# Patient Record
Sex: Male | Born: 1965 | Race: Black or African American | Hispanic: No | Smoking: Current every day smoker
Health system: Southern US, Community
[De-identification: ages and names within clinical notes are randomized; demographics above are authoritative.]

## PROBLEM LIST (undated history)

## (undated) DIAGNOSIS — N529 Male erectile dysfunction, unspecified: Secondary | ICD-10-CM

## (undated) DIAGNOSIS — N401 Enlarged prostate with lower urinary tract symptoms: Secondary | ICD-10-CM

## (undated) DIAGNOSIS — N138 Other obstructive and reflux uropathy: Secondary | ICD-10-CM

## (undated) DIAGNOSIS — F1910 Other psychoactive substance abuse, uncomplicated: Secondary | ICD-10-CM

## (undated) HISTORY — PX: BUNIONECTOMY: SHX129

## (undated) HISTORY — DX: Other psychoactive substance abuse, uncomplicated: F19.10

## (undated) HISTORY — PX: KNEE ARTHROSCOPY: SHX127

## (undated) HISTORY — DX: Other obstructive and reflux uropathy: N13.8

## (undated) HISTORY — DX: Male erectile dysfunction, unspecified: N52.9

## (undated) HISTORY — DX: Other obstructive and reflux uropathy: N40.1

---

## 2016-12-08 ENCOUNTER — Emergency Department
Admission: EM | Admit: 2016-12-08 | Discharge: 2016-12-08 | Disposition: A | Discharge status: Home / Self Care | Payer: Self-pay | Attending: Emergency Medicine | Admitting: Emergency Medicine

## 2016-12-08 ENCOUNTER — Emergency Department: Payer: Self-pay

## 2016-12-08 ENCOUNTER — Encounter: Payer: Self-pay | Admitting: Emergency Medicine

## 2016-12-08 DIAGNOSIS — X501XXA Overexertion from prolonged static or awkward postures, initial encounter: Secondary | ICD-10-CM | POA: Insufficient documentation

## 2016-12-08 DIAGNOSIS — Y9367 Activity, basketball: Secondary | ICD-10-CM | POA: Insufficient documentation

## 2016-12-08 DIAGNOSIS — S83411A Sprain of medial collateral ligament of right knee, initial encounter: Secondary | ICD-10-CM | POA: Insufficient documentation

## 2016-12-08 DIAGNOSIS — Y929 Unspecified place or not applicable: Secondary | ICD-10-CM | POA: Insufficient documentation

## 2016-12-08 DIAGNOSIS — Y999 Unspecified external cause status: Secondary | ICD-10-CM | POA: Insufficient documentation

## 2016-12-08 MED ORDER — MELOXICAM 15 MG PO TABS: 15.0000 mg | ORAL_TABLET | Freq: Every day | ORAL | 0 refills | Status: DC

## 2016-12-08 MED ORDER — TRAMADOL HCL 50 MG PO TABS: 50.0000 mg | ORAL_TABLET | Freq: Four times a day (QID) | ORAL | 0 refills | Status: DC | PRN

## 2016-12-08 NOTE — ED Triage Notes (Signed)
Pt states he was playing basketball last night when he went up for a jump states he felt his leg twist in the opposite direction.  Put has brace on right knee and using crutches.

## 2016-12-08 NOTE — ED Provider Notes (Signed)
[facility] Emergency Department Provider Note ____________________________________________  Time seen: Approximately 11:46 AM  I have reviewed the triage vital signs and the nursing notes.   HISTORY  Chief Complaint Leg Pain    HPI Philip Collins is a 51 y.o. male who presents to the emergency department for evaluation of right knee pain. While playing basketball last night, he went up for a jump and states that he felt his leg twisted in an awkward manner. Since that time it has remained painful. He has applied compression, elevated, iced it, and use crutches but pain does not seem to be improving. He states that he did take some ibuprofen which did not seem to help. He denies previous injury to the right knee.  History reviewed. No pertinent past medical history.  There are no active problems to display for this patient.   Past Surgical History:  Procedure Laterality Date  . KNEE ARTHROSCOPY      Prior to Admission medications   Medication Sig Start Date End Date Taking? Authorizing Provider  meloxicam (MOBIC) 15 MG tablet Take 1 tablet (15 mg total) by mouth daily. 12/08/16   Parthiv Mucci B, FNP  traMADol (ULTRAM) 50 MG tablet Take 1 tablet (50 mg total) by mouth every 6 (six) hours as needed. 12/08/16   Victorino Dike, FNP    Allergies Patient has no known allergies.  History reviewed. No pertinent family history.  Social History Social History  Substance Use Topics  . Smoking status: Never Smoker  . Smokeless tobacco: Never Used  . Alcohol use No    Review of Systems Constitutional: Well appearing Respiratory: Negative for shortness for Musculoskeletal: Positive for right knee pain. Skin: Negative for lesion or wound  Neurological: Negative for paresthesias or loss of sensation of the right lower extremity  ____________________________________________   PHYSICAL EXAM:  VITAL SIGNS: ED Triage Vitals [12/08/16 1052]  Enc Vitals  Group     BP (!) 148/101     Pulse Rate 76     Resp 18     Temp 97.9 F (36.6 C)     Temp Source Oral     SpO2 100 %     Weight 175 lb (79.4 kg)     Height 6' (1.829 m)     Head Circumference      Peak Flow      Pain Score      Pain Loc      Pain Edu?      Excl. in [place]?     Constitutional: Alert and oriented. Well appearing and in no acute distress. Eyes: Conjunctivae are clear without drainage  Head: Atraumatic Neck: Nexus criteria is negative. No stridor. Respiratory: Respirations are even and unlabored. Musculoskeletal: Right knee has limited range of motion secondary to pain. Patient is unable to fully extend the knee without increasing pain. Tenderness is directly over the medial aspect of the knee. Pain is worse with valgus stress. Neurologic: 2 point discrimination intact  Skin: Warm and dry without lesion or wound.  Psychiatric: Normal behavior. Viral affect.  ____________________________________________   LABS (all labs ordered are listed, but only abnormal results are displayed)  Labs Reviewed - No data to display ____________________________________________  RADIOLOGY  Right knee film negative for acute bony abnormality per radiology. ____________________________________________   PROCEDURES  Procedure(s) performed: None  ____________________________________________   INITIAL IMPRESSION / ASSESSMENT AND PLAN / ED COURSE  Philip Collins is a 51 y.o. male who presents to the emergency department  for evaluation of right knee pain. Symptoms and exam consistent with medial collateral strain of the right knee. Patient is to follow-up with the orthopedist and continue to use his knee brace and crutches. He was encouraged to return to the emergency department for any symptom changes or worsens if he is unable schedule an appointment.   Pertinent labs & imaging results that were available during my care of the patient were reviewed by me and considered in my  medical decision making (see chart for details).  _________________________________________   FINAL CLINICAL IMPRESSION(S) / ED DIAGNOSES  Final diagnoses:  Sprain of medial collateral ligament of right knee, initial encounter    Discharge Medication List as of 12/08/2016 11:56 AM    START taking these medications   Details  meloxicam (MOBIC) 15 MG tablet Take 1 tablet (15 mg total) by mouth daily., Starting Sun 12/08/2016, Print    traMADol (ULTRAM) 50 MG tablet Take 1 tablet (50 mg total) by mouth every 6 (six) hours as needed., Starting Sun 12/08/2016, Print        If controlled substance prescribed during this visit, 12 month history viewed on the [place] prior to issuing an initial prescription for Schedule II or III opiod.    Victorino Dike, FNP 12/12/16 2039    Harvest Dark, MD 12/14/16 [phone]

## 2017-12-10 ENCOUNTER — Emergency Department
Admission: EM | Admit: 2017-12-10 | Discharge: 2017-12-10 | Disposition: A | Discharge status: Home / Self Care | Payer: Self-pay | Attending: Student in an Organized Health Care Education/Training Program | Admitting: Student in an Organized Health Care Education/Training Program

## 2017-12-10 ENCOUNTER — Encounter: Payer: Self-pay | Admitting: Emergency Medicine

## 2017-12-10 ENCOUNTER — Other Ambulatory Visit: Payer: Self-pay

## 2017-12-10 DIAGNOSIS — F17210 Nicotine dependence, cigarettes, uncomplicated: Secondary | ICD-10-CM | POA: Insufficient documentation

## 2017-12-10 DIAGNOSIS — J069 Acute upper respiratory infection, unspecified: Secondary | ICD-10-CM | POA: Insufficient documentation

## 2017-12-10 DIAGNOSIS — B9789 Other viral agents as the cause of diseases classified elsewhere: Secondary | ICD-10-CM | POA: Insufficient documentation

## 2017-12-10 MED ORDER — PSEUDOEPH-BROMPHEN-DM 30-2-10 MG/5ML PO SYRP: 5.0000 mL | ORAL_SOLUTION | Freq: Four times a day (QID) | ORAL | 0 refills | Status: DC | PRN

## 2017-12-10 NOTE — ED Notes (Signed)
D/C form printed, signed by patient and sent to Medical Records.

## 2017-12-10 NOTE — ED Provider Notes (Addendum)
[facility] Emergency Department Provider Note  ____________________________________________   First MD Initiated Contact with Patient 12/10/17 1323     (approximate)  I have reviewed the triage vital signs and the nursing notes.   HISTORY  Chief Complaint Cough   HPI Philip Collins is a 52 y.o. male is here complaining of yellow productive cough for the last 8 days.  Patient states he is tried some over-the-counter medication such as Alka-Seltzer and Mucinex without any relief.  He states that initially he was sneezing a great deal.  He denies any fever or chills.  He denies any throat pain.  Patient is a longtime smoker smoking 1/2 pack cigarettes on the average per day.   History reviewed. No pertinent past medical history.  There are no active problems to display for this patient.   Past Surgical History:  Procedure Laterality Date  . BUNIONECTOMY    . KNEE ARTHROSCOPY      Prior to Admission medications   Medication Sig Start Date End Date Taking? Authorizing Provider  brompheniramine-pseudoephedrine-DM 30-2-10 MG/5ML syrup Take 5 mLs by mouth 4 (four) times daily as needed. 12/10/17   Johnn Hai, PA-C    Allergies Patient has no known allergies.  No family history on file.  Social History Social History   Tobacco Use  . Smoking status: Never Smoker  . Smokeless tobacco: Never Used  Substance Use Topics  . Alcohol use: No  . Drug use: Not on file    Review of Systems Constitutional: No fever/chills ENT: No sore throat.  Negative for ear pain. Cardiovascular: Denies chest pain. Respiratory: Denies shortness of breath.  Positive for productive cough Gastrointestinal: No abdominal pain.  No nausea, no vomiting.   Musculoskeletal: Negative for muscle aches. Skin: Negative for rash. Neurological: Negative for headaches, focal weakness or numbness. ____________________________________________   PHYSICAL EXAM:  VITAL  SIGNS: ED Triage Vitals  Enc Vitals Group     BP      Pulse      Resp      Temp      Temp src      SpO2      Weight      Height      Head Circumference      Peak Flow      Pain Score      Pain Loc      Pain Edu?      Excl. in [place]?    Constitutional: Alert and oriented. Well appearing and in no acute distress. Eyes: Conjunctivae are normal.  Head: Atraumatic. Nose: Minimal congestion/rhinnorhea.  TMs are dull bilaterally. Mouth/Throat: Mucous membranes are moist.  Oropharynx non-erythematous.  Mild posterior drainage noted.  Uvula is midline. Neck: No stridor.   Hematological/Lymphatic/Immunilogical: No cervical lymphadenopathy. Cardiovascular: Normal rate, regular rhythm. Grossly normal heart sounds.  Good peripheral circulation. Respiratory: Normal respiratory effort.  No retractions. Lungs CTAB. Musculoskeletal: Moves upper and lower extremities without any difficulty and normal gait was noted. Neurologic:  Normal speech and language. No gross focal neurologic deficits are appreciated.  Skin:  Skin is warm, dry and intact. No rash noted. Psychiatric: Mood and affect are normal. Speech and behavior are normal.  ____________________________________________   LABS (all labs ordered are listed, but only abnormal results are displayed)  Labs Reviewed - No data to display  PROCEDURES  Procedure(s) performed: None  Procedures  Critical Care performed: No  ____________________________________________   INITIAL IMPRESSION / ASSESSMENT AND PLAN / ED COURSE  As part of my medical decision making, I reviewed the following data within the electronic MEDICAL RECORD NUMBER Notes from prior ED visits and [place] Controlled Substance Database  Patient was started on Bromfed-DM for active cough for 8 days.  Patient is encouraged to discontinue smoking.  [facility] any [facility] problems.  ____________________________________________   FINAL CLINICAL  IMPRESSION(S) / ED DIAGNOSES  Final diagnoses:  Viral URI with cough  Cigarette smoker     ED Discharge Orders        Ordered    brompheniramine-pseudoephedrine-DM 30-2-10 MG/5ML syrup  4 times daily PRN     12/10/17 1346       Note:  This document was prepared using Dragon voice recognition software and may include unintentional dictation errors.    Johnn Hai, PA-C 12/10/17 1348    Johnn Hai, PA-C 12/10/17 1356    Merlyn Lot, MD 12/10/17 [phone]

## 2017-12-10 NOTE — Discharge Instructions (Addendum)
[facility] if any continued problems.  Begin taking Bromfed-DM as needed for cough and congestion every 6 hours.  Increase fluids.  Decrease smoking.

## 2017-12-10 NOTE — ED Triage Notes (Signed)
Patient says he has had a cough-productive yellow/brown and red streaks now--for 8 days.  He has tried multitple otc medications without relief.  No fever.  Says hit hurts his neck and chest when he coughs.  When not coughing his neck still hurts.  Says decreased appetitie as well.

## 2017-12-10 NOTE — ED Notes (Signed)
Pt verbalizes understanding of d/c instructions, medications and follow up 

## 2018-11-27 IMAGING — CR DG KNEE COMPLETE 4+V*R*
1 series · 5 of 5 positions shown · non-contrast
Comparison: None.

CLINICAL DATA: Basketball injury last night while jumping. Unable
to walk or bear weight.

EXAM:
RIGHT KNEE - COMPLETE 4+ VIEW

[Series 1: t knee ap right · 0.14mm/px · 5 of 5 slices shown]
[im 1/5]
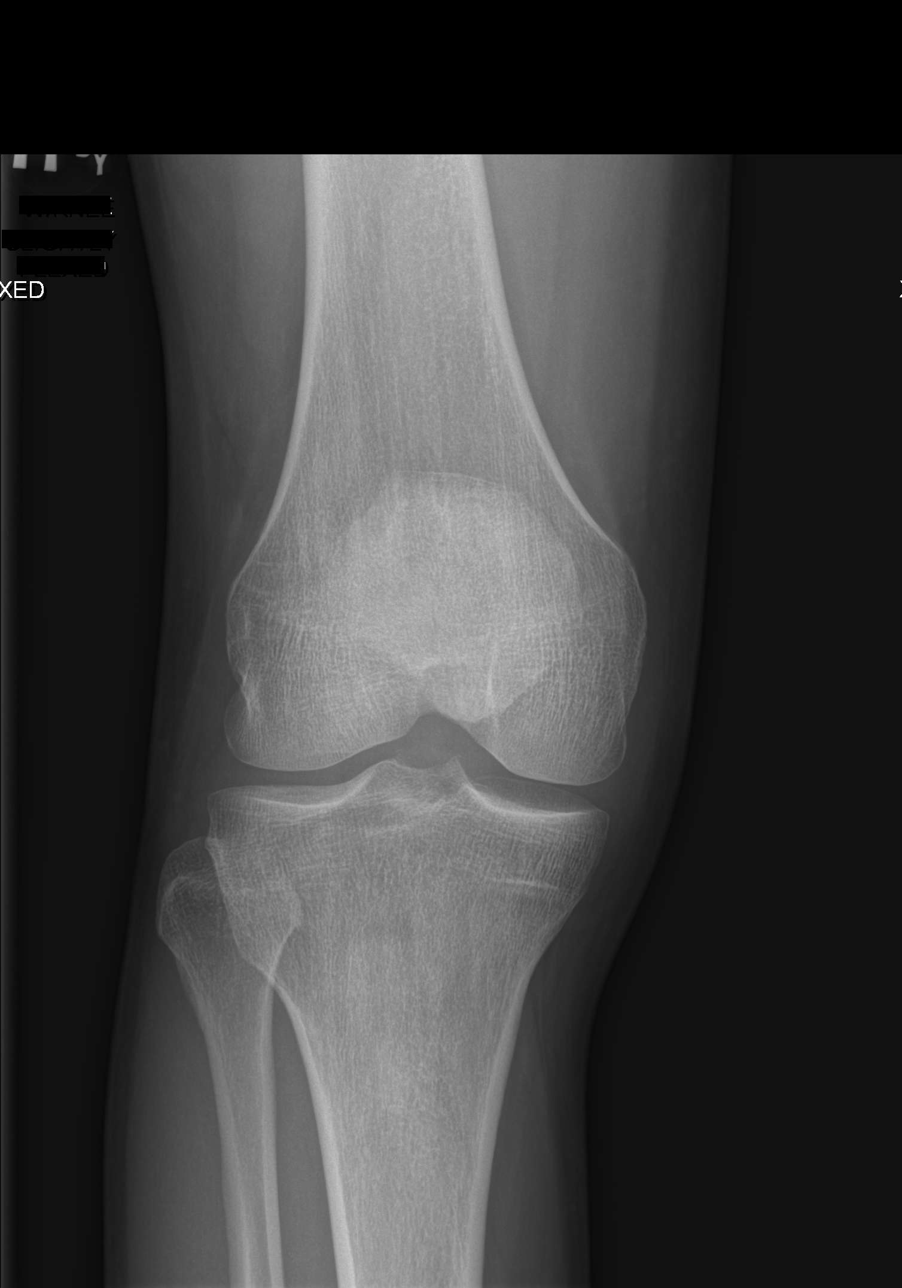
[im 2/5]
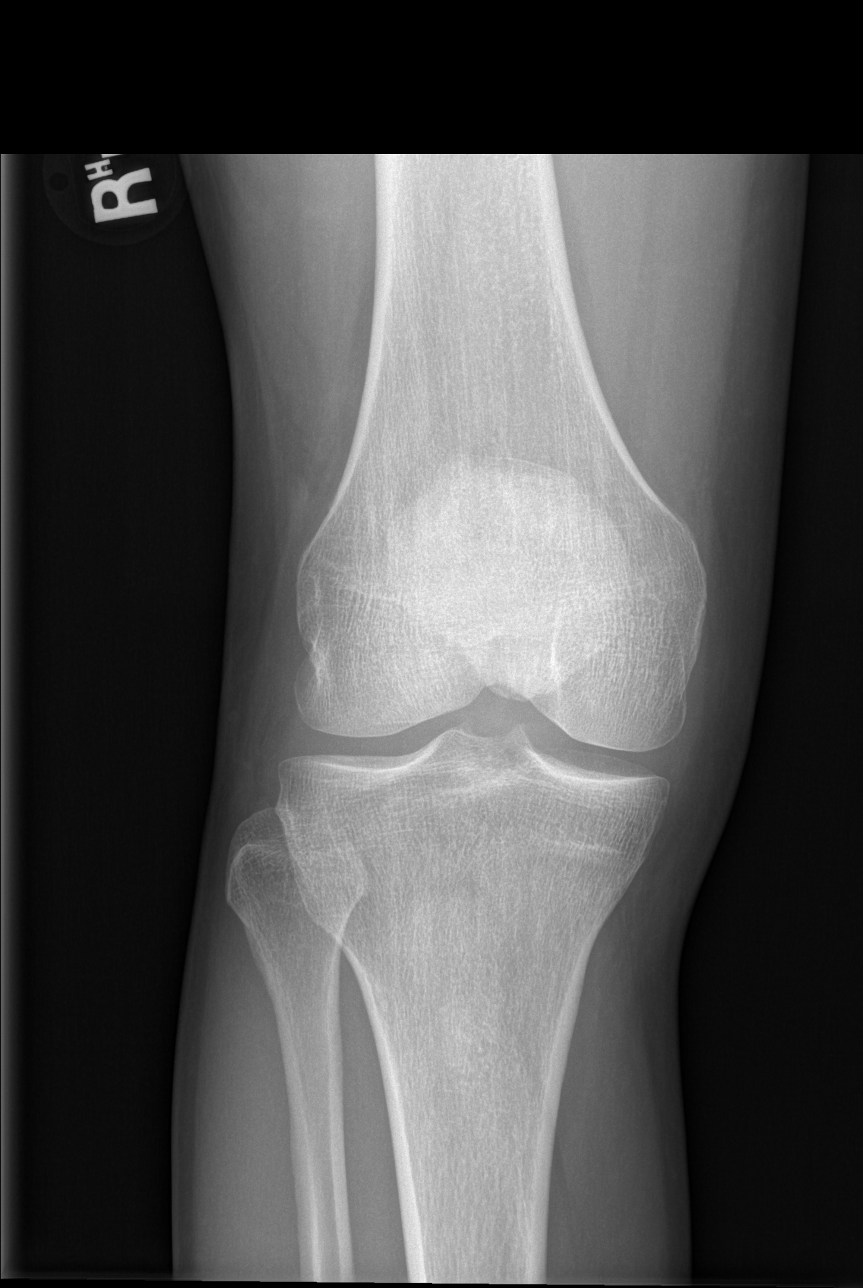
[im 3/5]
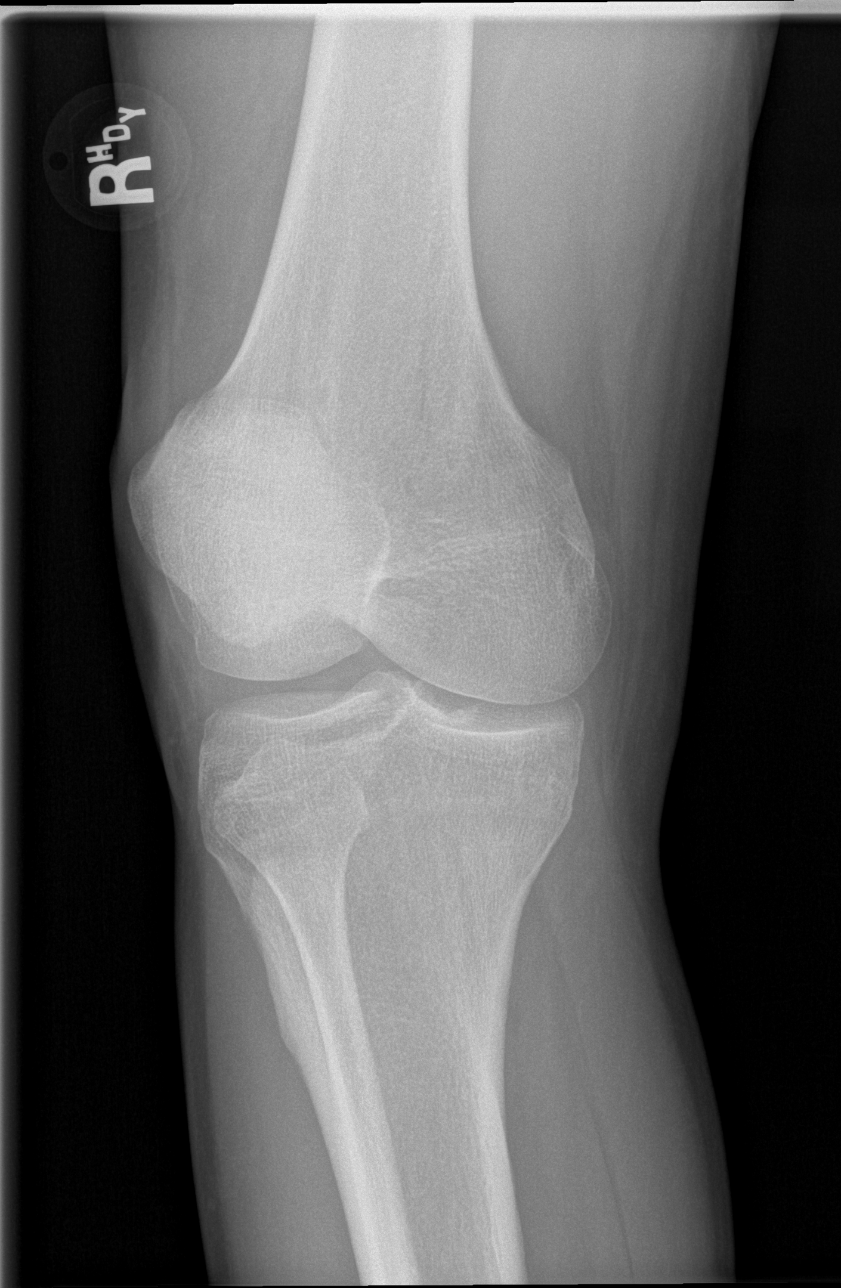
[im 4/5]
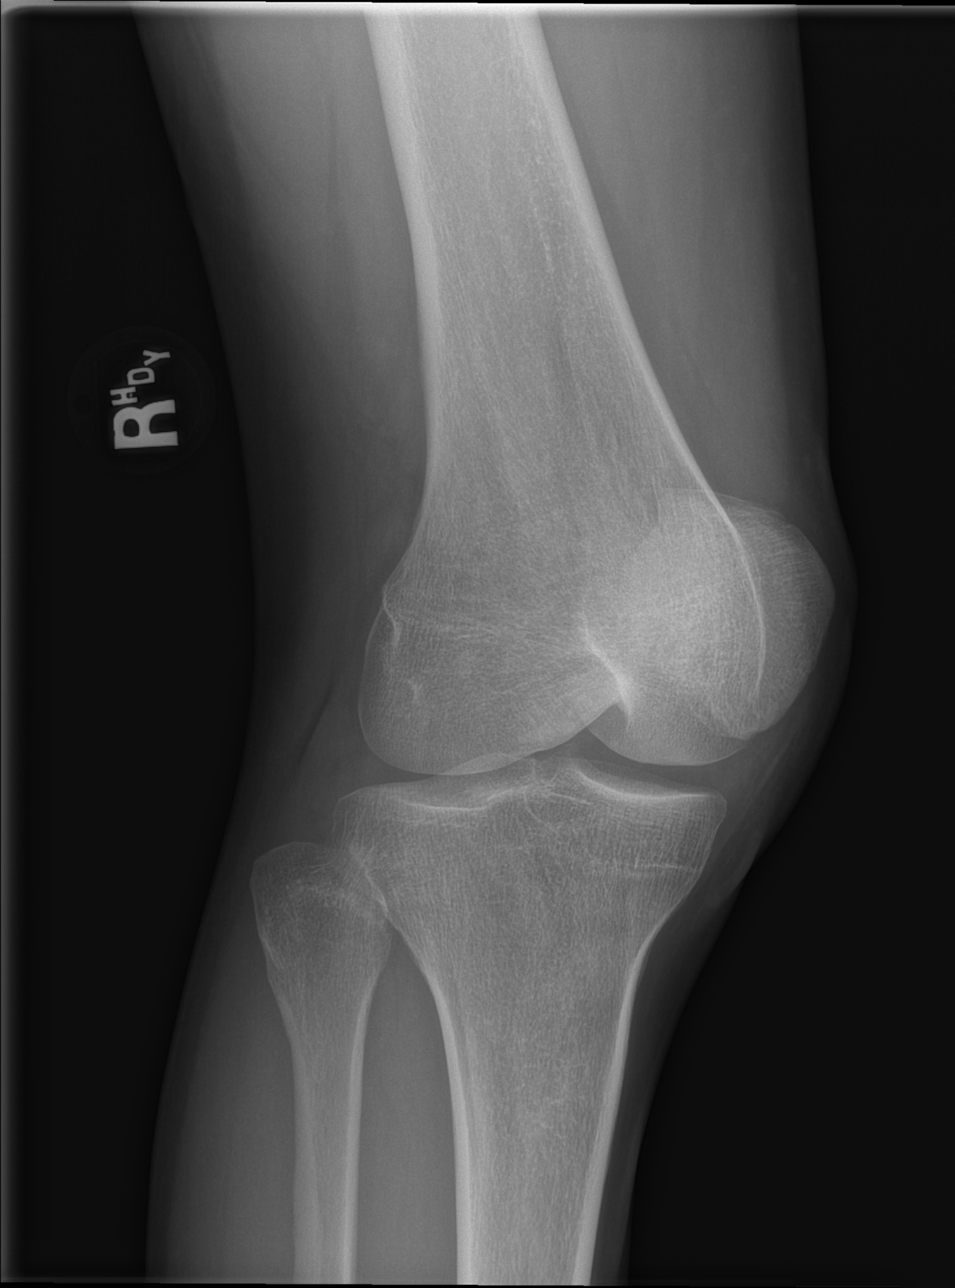
[im 5/5]
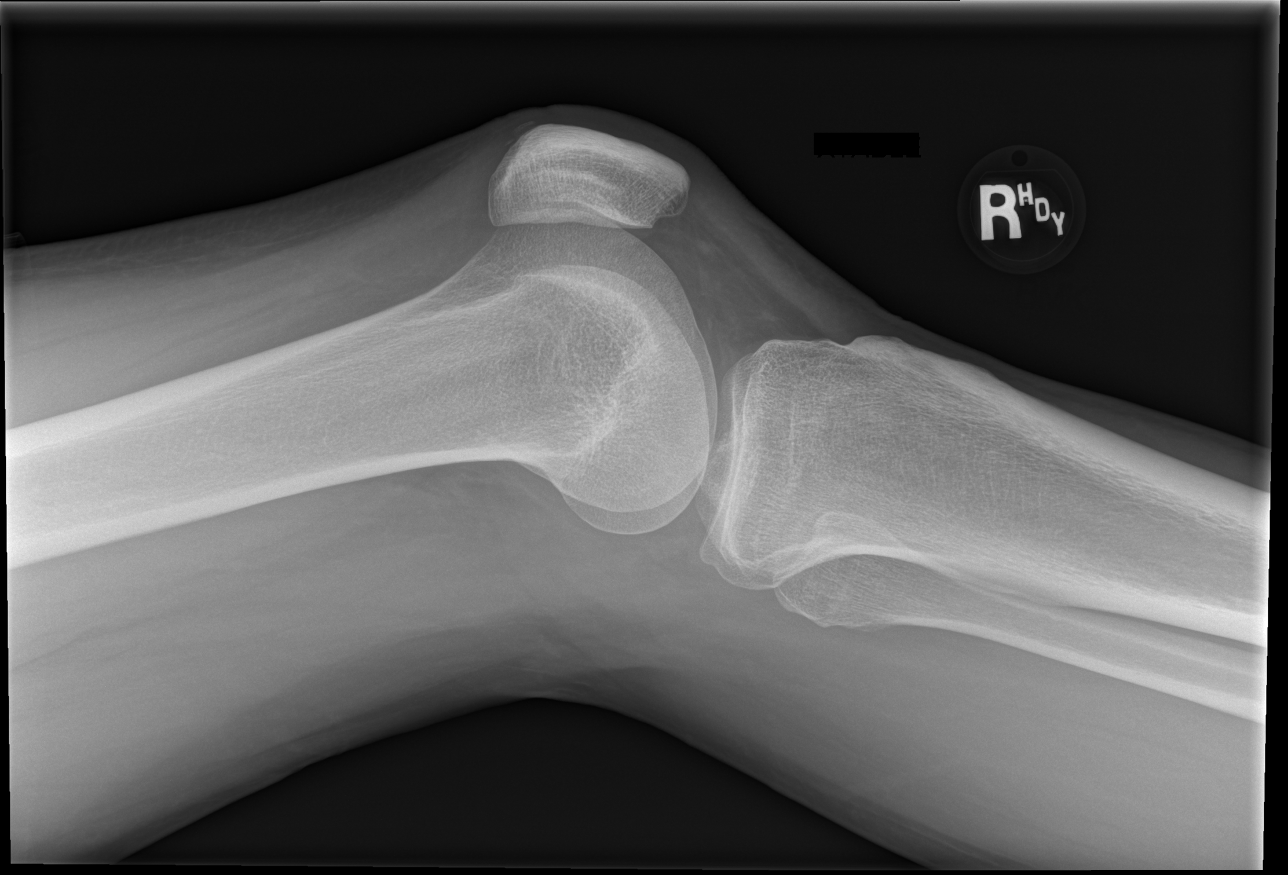

[5 of 5 positions shown; findings below may reference images not displayed]

FINDINGS: No fracture. No subluxation or dislocation. No worrisome lytic or
sclerotic osseous abnormality. No evidence for joint effusion in the
suprapatellar bursa.
IMPRESSION: Negative.

## 2021-10-04 ENCOUNTER — Emergency Department
Admission: EM | Admit: 2021-10-04 | Discharge: 2021-10-04 | Disposition: A | Discharge status: Home / Self Care | Payer: Self-pay | Attending: Emergency Medicine | Admitting: Emergency Medicine

## 2021-10-04 ENCOUNTER — Other Ambulatory Visit: Payer: Self-pay

## 2021-10-04 ENCOUNTER — Encounter: Payer: Self-pay | Admitting: Emergency Medicine

## 2021-10-04 ENCOUNTER — Emergency Department: Payer: Self-pay

## 2021-10-04 DIAGNOSIS — R634 Abnormal weight loss: Secondary | ICD-10-CM | POA: Insufficient documentation

## 2021-10-04 DIAGNOSIS — J069 Acute upper respiratory infection, unspecified: Secondary | ICD-10-CM | POA: Insufficient documentation

## 2021-10-04 DIAGNOSIS — R5383 Other fatigue: Secondary | ICD-10-CM

## 2021-10-04 DIAGNOSIS — Z20822 Contact with and (suspected) exposure to covid-19: Secondary | ICD-10-CM | POA: Insufficient documentation

## 2021-10-04 LAB — COMPREHENSIVE METABOLIC PANEL
ALT: 18 U/L (ref 0–44)
AST: 21 U/L (ref 15–41)
Albumin: 3.3 g/dL — ABNORMAL LOW (ref 3.5–5.0)
Alkaline Phosphatase: 63 U/L (ref 38–126)
Anion gap: 7 (ref 5–15)
BUN: 14 mg/dL (ref 6–20)
CO2: 29 mmol/L (ref 22–32)
Calcium: 8.8 mg/dL — ABNORMAL LOW (ref 8.9–10.3)
Chloride: 105 mmol/L (ref 98–111)
Creatinine, Ser: 0.96 mg/dL (ref 0.61–1.24)
GFR, Estimated: >60 mL/min (ref >60)
Glucose, Bld: 98 mg/dL (ref 70–99)
Potassium: 4.4 mmol/L (ref 3.5–5.1)
Sodium: 141 mmol/L (ref 135–145)
Total Bilirubin: 0.6 mg/dL (ref 0.3–1.2)
Total Protein: 6.8 g/dL (ref 6.5–8.1)

## 2021-10-04 LAB — CBC WITH DIFFERENTIAL/PLATELET
Abs Immature Granulocytes: 0.01 10*3/uL (ref 0.00–0.07)
Basophils Absolute: 0.1 10*3/uL (ref 0.0–0.1)
Basophils Relative: 1 %
Eosinophils Absolute: 0.2 10*3/uL (ref 0.0–0.5)
Eosinophils Relative: 4 %
HCT: 43.2 % (ref 39.0–52.0)
Hemoglobin: 14.1 g/dL (ref 13.0–17.0)
Immature Granulocytes: 0 %
Lymphocytes Relative: 44 %
Lymphs Abs: 2.3 10*3/uL (ref 0.7–4.0)
MCH: 29.6 pg (ref 26.0–34.0)
MCHC: 32.6 g/dL (ref 30.0–36.0)
MCV: 90.6 fL (ref 80.0–100.0)
Monocytes Absolute: 0.4 10*3/uL (ref 0.1–1.0)
Monocytes Relative: 8 %
Neutro Abs: 2.3 10*3/uL (ref 1.7–7.7)
Neutrophils Relative %: 43 %
Platelets: 235 10*3/uL (ref 150–400)
RBC: 4.77 MIL/uL (ref 4.22–5.81)
RDW: 12.8 % (ref 11.5–15.5)
WBC: 5.3 10*3/uL (ref 4.0–10.5)
nRBC: 0 % (ref 0.0–0.2)

## 2021-10-04 LAB — TROPONIN I (HIGH SENSITIVITY): Troponin I (High Sensitivity): 5 ng/L (ref <18)

## 2021-10-04 LAB — TSH: TSH: 0.775 u[IU]/mL (ref 0.350–4.500)

## 2021-10-04 LAB — RESP PANEL BY RT-PCR (FLU A&B, COVID) ARPGX2
Influenza A by PCR: NEGATIVE
Influenza B by PCR: NEGATIVE
SARS Coronavirus 2 by RT PCR: NEGATIVE

## 2021-10-04 MED ORDER — BENZONATATE 100 MG PO CAPS: 100.0000 mg | ORAL_CAPSULE | Freq: Three times a day (TID) | ORAL | 0 refills | Status: DC | PRN

## 2021-10-04 MED ORDER — AZITHROMYCIN 250 MG PO TABS: ORAL_TABLET | ORAL | 0 refills | Status: DC

## 2021-10-04 MED ORDER — SODIUM CHLORIDE 0.9 % IV BOLUS
1000.0000 mL | Freq: Once | INTRAVENOUS | Status: AC
  Administered 2021-10-04: 1000 mL via INTRAVENOUS

## 2021-10-04 NOTE — ED Triage Notes (Signed)
Pt to ED via POV c/o cough and runny nose x 1 week. Pt states that he is also having some weakness. Pt reports that his cough is productive. Pt states he is getting up dark brown phlegm. Pt is able to speak in complete sentences and is in NAD.  ?

## 2021-10-04 NOTE — Discharge Instructions (Addendum)
Follow-up with one of the clinics listed above.  He will need to establish a regular doctor if he continued to have weight loss.  Your labs are normal.  Your thyroid is normal.  Your chest x-ray was normal.  COVID and flu test are negative.  In the enzyme for your heart muscle is normal. ?Take the antibiotic as prescribed.  Take the cough medicine as needed.  Drink plenty of fluids.  Take emergen-C vitamins.  This may help boost your immune system.  If you continue to worsen please return the emergency department. ?

## 2021-10-04 NOTE — ED Provider Notes (Addendum)
? ?[facility] ?Provider Note ? ? ? Event Date/Time  ? First MD Initiated Contact with Patient 10/04/21 1610   ?  (approximate) ? ? ?History  ? ?Cough ? ? ?HPI ? ?Philip Collins is a 56 y.o. male complains of weakness, productive cough, 8 pound weight loss this week, fatigue, denies fever or chills.  States his throat is a little irritated but not bad.  No vomiting or diarrhea.  States he has been eating and is still losing weight.  States he is short of breath.  Denies chest pain ? ?  ? ? ?Physical Exam  ? ?Triage Vital Signs: ?ED Triage Vitals  ?Enc Vitals Group  ?   BP 10/04/21 1610 110/81  ?   Pulse Rate 10/04/21 1610 64  ?   Resp 10/04/21 1610 18  ?   Temp 10/04/21 1610 97.9 ?F (36.6 ?C)  ?   Temp Source 10/04/21 1610 Oral  ?   SpO2 10/04/21 1610 100 %  ?   Weight 10/04/21 1611 164 lb 14.5 oz (74.8 kg)  ?   Height 10/04/21 1611 '5\' 11"'$  (1.803 m)  ?   Head Circumference --   ?   Peak Flow --   ?   Pain Score 10/04/21 1603 4  ?   Pain Loc --   ?   Pain Edu? --   ?   Excl. in [place]? --   ? ? ?Most recent vital signs: ?Vitals:  ? 10/04/21 1610  ?BP: 110/81  ?Pulse: 64  ?Resp: 18  ?Temp: 97.9 ?F (36.6 ?C)  ?SpO2: 100%  ? ? ? ?General: Awake, no distress.   ?CV:  Good peripheral perfusion. regular rate and  rhythm ?Resp:  Normal effort. Lungs CTA ?Abd:  No distention.   ?Other:  Patient is very thin. ? ? ?ED Results / Procedures / Treatments  ? ?Labs ?(all labs ordered are listed, but only abnormal results are displayed) ?Labs Reviewed  ?COMPREHENSIVE METABOLIC PANEL - Abnormal; Notable for the following components:  ?    Result Value  ? Calcium 8.8 (*)   ? Albumin 3.3 (*)   ? All other components within normal limits  ?RESP PANEL BY RT-PCR (FLU A&B, COVID) ARPGX2  ?CBC WITH DIFFERENTIAL/PLATELET  ?TSH  ?TROPONIN I (HIGH SENSITIVITY)  ?TROPONIN I (HIGH SENSITIVITY)  ? ? ? ?EKG ? ?EKG ? ? ?RADIOLOGY ?Chest x-ray ? ? ? ?PROCEDURES: ? ? ?Procedures ? ? ?MEDICATIONS ORDERED IN ED: ?Medications  ?sodium  chloride 0.9 % bolus 1,000 mL (1,000 mLs Intravenous New Bag/Given 10/04/21 1708)  ? ? ? ?IMPRESSION / MDM / ASSESSMENT AND PLAN / ED COURSE  ?I reviewed the triage vital signs and the nursing notes. ?             ?               ? ?Differential diagnosis includes, but is not limited to, CAP, influenza, COVID, MI, cancer, thyroid disease, ? ?Patient will be given IV with normal saline in hopes that the fatigue is coming from dehydration.  Labs are ordered imaging as ordered. ? ?Chest x-ray was independently reviewed by me, no infection is noted, confirmed by radiology. ? ?Labs are reassuring, CBC, metabolic panel, troponin are all normal.  Respiratory panel is negative for influenza or COVID.  And his TSH level is normal.  Cannot find a reason for his weight loss and fatigue. ? ?However when discussing the results with the patient.  He  did tell me that he donated plasma 2 weeks ago.  Explained to him that he should refrain from giving plasma if it is causing this much fatigue.  If he continues to have weight loss he needs to see a regular doctor.  He was given the addresses and phone number of 3 different clinics that help with discounted care.  He was given a work note.  Patient is in agreement treatment plan.  He was discharged stable condition. ? ?EKG showed bradycardia with 59 bpm, PR intervals 116 MS, QRS 76 MS, see physician read. ? ? ?  ? ? ?FINAL CLINICAL IMPRESSION(S) / ED DIAGNOSES  ? ?Final diagnoses:  ?Acute URI  ?Other fatigue  ?Weight loss, non-intentional  ? ? ? ?Rx / DC Orders  ? ?ED Discharge Orders   ? ?      Ordered  ?  azithromycin (ZITHROMAX Z-PAK) 250 MG tablet       ? 10/04/21 1822  ?  benzonatate (TESSALON PERLES) 100 MG capsule  3 times daily PRN       ? 10/04/21 1822  ? ?  ?  ? ?  ? ? ? ?Note:  This document was prepared using Dragon voice recognition software and may include unintentional dictation errors. ? ?  ?Versie Starks, PA-C ?10/04/21 1826 ? ?  ?Versie Starks, PA-C ?10/04/21  1829 ? ?  ?Blake Divine, MD ?10/04/21 1933 ? ?

## 2022-01-04 ENCOUNTER — Other Ambulatory Visit: Payer: Self-pay

## 2022-01-04 ENCOUNTER — Other Ambulatory Visit: Payer: Self-pay | Admitting: Pharmacy Technician

## 2022-01-04 NOTE — Progress Notes (Signed)
Patient currently uninsured.  Received 2023 poi.  Eligible for medication assistance at [facility] as long as eligibility continues to be met.  Philip Collins J. Zyann Mabry Patient Advocate Specialist [facility]  

## 2022-01-15 ENCOUNTER — Ambulatory Visit: Payer: Medicaid Other | Admitting: Gerontology

## 2022-01-15 ENCOUNTER — Other Ambulatory Visit: Payer: Self-pay

## 2022-01-15 ENCOUNTER — Encounter: Payer: Self-pay | Admitting: Gerontology

## 2022-01-15 VITALS — BP 106/70 | HR 54 | Temp 97.7°F | Resp 16 | Ht 71.0 in | Wt 167.6 lb

## 2022-01-15 DIAGNOSIS — R001 Bradycardia, unspecified: Secondary | ICD-10-CM | POA: Insufficient documentation

## 2022-01-15 DIAGNOSIS — Z7689 Persons encountering health services in other specified circumstances: Secondary | ICD-10-CM | POA: Insufficient documentation

## 2022-01-15 DIAGNOSIS — M79651 Pain in right thigh: Secondary | ICD-10-CM

## 2022-01-15 DIAGNOSIS — IMO0001 Reserved for inherently not codable concepts without codable children: Secondary | ICD-10-CM

## 2022-01-15 DIAGNOSIS — S025XXA Fracture of tooth (traumatic), initial encounter for closed fracture: Secondary | ICD-10-CM

## 2022-01-15 DIAGNOSIS — R339 Retention of urine, unspecified: Secondary | ICD-10-CM

## 2022-01-15 DIAGNOSIS — F17200 Nicotine dependence, unspecified, uncomplicated: Secondary | ICD-10-CM | POA: Insufficient documentation

## 2022-01-15 HISTORY — DX: Retention of urine, unspecified: R33.9

## 2022-01-15 HISTORY — DX: Bradycardia, unspecified: R00.1

## 2022-01-15 HISTORY — DX: Fracture of tooth (traumatic), initial encounter for closed fracture: S02.5XXA

## 2022-01-15 NOTE — Progress Notes (Signed)
New Patient Office Visit  Subjective    Patient ID: Philip Collins, male    DOB: 31-Jul-1965  Age: 56 y.o. MRN: [id number]  CC:  Chief Complaint  Patient presents with   Establish Care   Leg Pain    Patient c/o right upper thigh pain off & on x 3 weeks. No injury noted. Patient states pain is worse with ambulation.    HPI Philip Collins  is a 56 y/o male with no significant past medical history presents to establish care. He resides at [facility] for rehabilitation. He smokes 1/2 pack of cigarette daily and admits the desire to quit. He reports experiencing intermittent, nontraumatic, nonradiating sharp pain to right inner thigh that started 3 weeks ago. He has not taking any medication for the pain, states that pain affects his walking and has noticed the pain all caught 7 times in the past 3 weeks. He states that pain might last for several hours before it resolves.  He denies any aggravating or relieving factors.  He denies any swelling, erythema to perianal area and penile discharge. He also reports straining with urination, urgency and decrease urine flow.He reports having a broken tooth and requests dental referral.  His heart rate was 54 bpm, he denies chest pain, fatigue, dizziness and shortness of breath.  Overall, he states that he is doing well and offers no further complaint.   Outpatient Encounter Medications as of 01/15/2022  Medication Sig   [DISCONTINUED] azithromycin (ZITHROMAX Z-PAK) 250 MG tablet 2 pills today then 1 pill a day for 4 days   [DISCONTINUED] benzonatate (TESSALON PERLES) 100 MG capsule Take 1 capsule (100 mg total) by mouth 3 (three) times daily as needed for cough.   [DISCONTINUED] brompheniramine-pseudoephedrine-DM 30-2-10 MG/5ML syrup Take 5 mLs by mouth 4 (four) times daily as needed.   No facility-administered encounter medications on file as of 01/15/2022.    Past Medical History:  Diagnosis Date   Substance abuse ([place])    drug    Past Surgical History:   Procedure Laterality Date   BUNIONECTOMY Bilateral    ~30 years ago   KNEE ARTHROSCOPY Left    ~when patient was [age]    Family History  Problem Relation Age of Onset   Hypertension Mother    Diabetes Mother    Kidney failure Mother    Other Father        "gum disease"   Hypertension Sister    Hypertension Sister    Other Maternal Grandmother        unknown medical history   Dementia Maternal Grandfather    Other Paternal Grandmother        unknown medical history   Other Paternal Grandfather        unknown medical history    Social History   Socioeconomic History   Marital status: Divorced    Spouse name: Not on file   Number of children: Not on file   Years of education: Not on file   Highest education level: Not on file  Occupational History   Not on file  Tobacco Use   Smoking status: Every Day    Packs/day: 0.50    Years: 37.00    Total pack years: 18.50    Types: Cigarettes   Smokeless tobacco: Never   Tobacco comments:    Patient is at [facility] since 10/10/21, previous to that he was homeless  Vaping Use   Vaping Use: Never used  Substance and Sexual Activity  Alcohol use: Not Currently    Comment: occassional, last use 10/10/21   Drug use: Not Currently    Types: Marijuana, Cocaine, "Crack" cocaine, Methamphetamines    Comment: last use 10/10/21   Sexual activity: Not on file  Other Topics Concern   Not on file  Social History Narrative   Not on file   Social Determinants of Health   Financial Resource Strain: Not on file  Food Insecurity: Food Insecurity Present (01/15/2022)   Hunger Vital Sign    Worried About Running Out of Food in the Last Year: Often true    Ran Out of Food in the Last Year: Often true  Transportation Needs: No Transportation Needs (01/15/2022)   PRAPARE - Hydrologist (Medical): No    Lack of Transportation (Non-Medical): No  Physical Activity: Not on file  Stress: Not on file  Social  Connections: Not on file  Intimate Partner Violence: Not on file    Review of Systems  Constitutional: Negative.   HENT: Negative.    Eyes: Negative.   Respiratory: Negative.    Cardiovascular: Negative.   Gastrointestinal: Negative.   Genitourinary: Negative.   Musculoskeletal: Negative.   Skin: Negative.   Neurological: Negative.   Endo/Heme/Allergies: Negative.   Psychiatric/Behavioral: Negative.          Objective    BP 106/70 (BP Location: Left Arm, Patient Position: Sitting, Cuff Size: Large)   Pulse (!) 54   Temp 97.7 F (36.5 C) (Oral)   Resp 16   Ht '5\' 11"'$  (1.803 m)   Wt 167 lb 9.6 oz (76 kg)   SpO2 98%   BMI 23.38 kg/m   Physical Exam HENT:     Head: Normocephalic.     Nose: Nose normal.     Mouth/Throat:     Mouth: Mucous membranes are moist.  Eyes:     Extraocular Movements: Extraocular movements intact.     Conjunctiva/sclera: Conjunctivae normal.     Pupils: Pupils are equal, round, and reactive to light.  Cardiovascular:     Rate and Rhythm: Bradycardia present.  Pulmonary:     Effort: Pulmonary effort is normal.     Breath sounds: Normal breath sounds.  Abdominal:     General: Abdomen is flat. Bowel sounds are normal.     Palpations: Abdomen is soft.  Genitourinary:    Comments: Deferred per patient Musculoskeletal:        General: Normal range of motion.     Cervical back: Normal range of motion.  Skin:    General: Skin is warm.  Neurological:     General: No focal deficit present.     Mental Status: He is alert and oriented to person, place, and time. Mental status is at baseline.  Psychiatric:        Mood and Affect: Mood normal.        Behavior: Behavior normal.        Thought Content: Thought content normal.        Judgment: Judgment normal.         Assessment & Plan:   1. Encounter to establish care -Routine labs will be checked. - HgB A1c; Future - PSA; Future - Lipid panel; Future - Lipid panel - PSA - HgB  A1c  2. Bradycardia -His heart rate was 54 bpm, he was advised to increase water intake and go to the emergency room for worsening symptoms.  3. Smoking -He was encouraged on smoking cessation  and will follow-up with  - CT CHEST LUNG CA SCREEN LOW DOSE W/O CM; Future  4. Incomplete bladder emptying -Urinalysis will be done and he was advised to complete [employer] application for - Ambulatory referral to Urology - Urinalysis; Future - Urinalysis  5. Closed fracture of tooth, initial encounter -He was provided with application for dental referral.   6. Right thigh pain -He was advised to take OTC  500 mg Tylenol for pain every 12 hours, and to go to the emergency room for worsening symptoms.  Return in about 4 weeks (around 02/12/2022), or if symptoms worsen or fail to improve.   Akeria Hedstrom Jerold Coombe, NP

## 2022-01-15 NOTE — Patient Instructions (Signed)
Smoking Tobacco Information, Adult Smoking tobacco can be harmful to your health. Tobacco contains a toxic colorless chemical called nicotine. Nicotine causes changes in your brain that make you want more and more. This is called addiction. This can make it hard to stop smoking once you start. Tobacco also has other toxic chemicals that can hurt your body and raise your risk of many cancers. Menthol or "lite" tobacco or cigarette brands are not safer than regular brands. How can smoking tobacco affect me? Smoking tobacco puts you at risk for: Cancer. Smoking is most commonly associated with lung cancer, but can also lead to cancer in other parts of the body. Chronic obstructive pulmonary disease (COPD). This is a long-term lung condition that makes it hard to breathe. It also gets worse over time. High blood pressure (hypertension), heart disease, stroke, heart attack, and lung infections, such as pneumonia. Cataracts. This is when the lenses in the eyes become clouded. Digestive problems. This may include peptic ulcers, heartburn, and gastroesophageal reflux disease (GERD). Oral health problems, such as gum disease, mouth sores, and tooth loss. Loss of taste and smell. Smoking also affects how you look and smell. Smoking may cause: Wrinkles. Yellow or stained teeth, fingers, and fingernails. Bad breath. Bad-smelling clothes and hair. Smoking tobacco can also affect your social life, because: It may be challenging to find places to smoke when away from home. Many workplaces, restaurants, hotels, and public places are tobacco-free. Smoking is expensive. This is due to the cost of tobacco and the long-term costs of treating health problems from smoking. Secondhand smoke may affect those around you. Secondhand smoke can cause lung cancer, breathing problems, and heart disease. Children of smokers have a higher risk for: Sudden infant death syndrome (SIDS). Ear infections. Lung infections. What  actions can I take to prevent health problems? Quit smoking  Do not start smoking. Quit if you already smoke. Do not replace cigarette smoking with vaping devices, such as e-cigarettes. Make a plan to quit smoking and commit to it. Look for programs to help you, and ask your health care provider for recommendations and ideas. Set a date and write down all the reasons you want to quit. Let your friends and family know you are quitting so they can help and support you. Consider finding friends who also want to quit. It can be easier to quit with someone else, so that you can support each other. Talk with your health care provider about using nicotine replacement medicines to help you quit. These include gum, lozenges, patches, sprays, or pills. If you try to quit but return to smoking, stay positive. It is common to slip up when you first quit, so take it one day at a time. Be prepared for cravings. When you feel the urge to smoke, chew gum or suck on hard candy. Lifestyle Stay busy. Take care of your body. Get plenty of exercise, eat a healthy diet, and drink plenty of water. Find ways to manage your stress, such as meditation, yoga, exercise, or time spent with friends and family. Ask your health care provider about having regular tests (screenings) to check for cancer. This may include blood tests, imaging tests, and other tests. Where to find support To get support to quit smoking, consider: Asking your health care provider for more information and resources. Joining a support group for people who want to quit smoking in your local community. There are many effective programs that may help you to quit. Calling the smokefree.gov counselor   helpline at 1-800-QUIT-NOW (1-800-784-8669). Where to find more information You may find more information about quitting smoking from: Centers for Disease Control and Prevention: cdc.gov/tobacco Smokefree.gov: smokefree.gov American Lung Association:  freedomfromsmoking.org Contact a health care provider if: You have problems breathing. Your lips, nose, or fingers turn blue. You have chest pain. You are coughing up blood. You feel like you will faint. You have other health changes that cause you to worry. Summary Smoking tobacco can negatively affect your health, the health of those around you, your finances, and your social life. Do not start smoking. Quit if you already smoke. If you need help quitting, ask your health care provider. Consider joining a support group for people in your local community who want to quit smoking. There are many effective programs that may help you to quit. This information is not intended to replace advice given to you by your health care provider. Make sure you discuss any questions you have with your health care provider. Document Revised: 06/19/2021 Document Reviewed: 06/19/2021 Elsevier Patient Education  2023 Elsevier Inc.  

## 2022-01-16 LAB — URINALYSIS
Bilirubin, UA: NEGATIVE
Glucose, UA: NEGATIVE
Ketones, UA: NEGATIVE
Leukocytes,UA: NEGATIVE
Nitrite, UA: NEGATIVE
Protein,UA: NEGATIVE
RBC, UA: NEGATIVE
Specific Gravity, UA: 1.014 (ref 1.005–1.030)
Urobilinogen, Ur: 0.2 mg/dL (ref 0.2–1.0)
pH, UA: 5.5 (ref 5.0–7.5)

## 2022-01-16 LAB — LIPID PANEL
Chol/HDL Ratio: 3.1 ratio (ref 0.0–5.0)
Cholesterol, Total: 159 mg/dL (ref 100–199)
HDL: 51 mg/dL (ref >39)
LDL Chol Calc (NIH): 81 mg/dL (ref 0–99)
Triglycerides: 154 mg/dL — ABNORMAL HIGH (ref 0–149)
VLDL Cholesterol Cal: 27 mg/dL (ref 5–40)

## 2022-01-16 LAB — PSA: Prostate Specific Ag, Serum: 1.5 ng/mL (ref 0.0–4.0)

## 2022-01-16 LAB — HEMOGLOBIN A1C
Est. average glucose Bld gHb Est-mCnc: 120 mg/dL
Hgb A1c MFr Bld: 5.8 % — ABNORMAL HIGH (ref 4.8–5.6)

## 2022-01-22 ENCOUNTER — Encounter: Payer: Self-pay | Admitting: *Deleted

## 2022-02-13 ENCOUNTER — Other Ambulatory Visit: Payer: Self-pay | Admitting: Gerontology

## 2022-02-13 ENCOUNTER — Other Ambulatory Visit: Payer: Self-pay

## 2022-02-13 ENCOUNTER — Encounter: Payer: Self-pay | Admitting: Gerontology

## 2022-02-13 ENCOUNTER — Ambulatory Visit: Payer: Medicaid Other | Admitting: Gerontology

## 2022-02-13 VITALS — BP 114/74 | HR 63 | Temp 98.0°F | Resp 18 | Ht 71.0 in | Wt 166.6 lb

## 2022-02-13 DIAGNOSIS — R7303 Prediabetes: Secondary | ICD-10-CM | POA: Insufficient documentation

## 2022-02-13 DIAGNOSIS — F17200 Nicotine dependence, unspecified, uncomplicated: Secondary | ICD-10-CM

## 2022-02-13 DIAGNOSIS — N6320 Unspecified lump in the left breast, unspecified quadrant: Secondary | ICD-10-CM | POA: Insufficient documentation

## 2022-02-13 DIAGNOSIS — Z0000 Encounter for general adult medical examination without abnormal findings: Secondary | ICD-10-CM

## 2022-02-13 HISTORY — DX: Prediabetes: R73.03

## 2022-02-13 HISTORY — DX: Unspecified lump in the left breast, unspecified quadrant: N63.20

## 2022-02-13 NOTE — Progress Notes (Signed)
Established Patient Office Visit  Subjective   Patient ID: Philip Collins, male    DOB: 1966/01/11  Age: 56 y.o. MRN: [id number]  Chief Complaint  Patient presents with   Follow-up    Firm Lump under left nipple, very irritated thinks it could be from working out.     HPI  Philip Collins  is a 56 y/o male with no significant past medical history presents for lab review. He resides at [facility] for rehabilitation. His labs done on 01/15/22 were unremarkable, except HgbA1c was 5.8% . He also c/o having a lump around 3 o'clock beside the nipple of his left breast that started a week ago after working out especially with doing bench presses. He states that he experienced similar lump at the same spot while in high school with doing bench press, but it resolved when he stopped. He states that lump is painless with touch. He denies history of breast cancer or any type of cancer. Overall, he states that he's doing well and offers no further complaint.  Review of Systems  Constitutional: Negative.   HENT: Negative.    Eyes: Negative.   Respiratory: Negative.    Cardiovascular: Negative.   Neurological: Negative.   Psychiatric/Behavioral: Negative.        Objective:     BP 114/74 (BP Location: Right Arm, Patient Position: Sitting, Cuff Size: Large)   Pulse 63   Temp 98 F (36.7 C) (Oral)   Resp 18   Ht '5\' 11"'$  (1.803 m)   Wt 166 lb 9.6 oz (75.6 kg)   SpO2 98%   BMI 23.24 kg/m  BP Readings from Last 3 Encounters:  02/13/22 114/74  01/15/22 106/70  10/04/21 112/78   Wt Readings from Last 3 Encounters:  02/13/22 166 lb 9.6 oz (75.6 kg)  01/15/22 167 lb 9.6 oz (76 kg)  10/04/21 164 lb 14.5 oz (74.8 kg)      Physical Exam HENT:     Head: Normocephalic and atraumatic.  Cardiovascular:     Rate and Rhythm: Normal rate and regular rhythm.     Pulses: Normal pulses.     Heart sounds: Normal heart sounds.  Pulmonary:     Effort: Pulmonary effort is normal.     Breath sounds: Normal  breath sounds.  Skin:    Comments: Lump to 3 O'clock beside nipple of left breast. No tenderness with palpation.  Neurological:     General: No focal deficit present.     Mental Status: He is alert and oriented to person, place, and time. Mental status is at baseline.      No results found for any visits on 02/13/22.  Last CBC Lab Results  Component Value Date   WBC 5.3 10/04/2021   HGB 14.1 10/04/2021   HCT 43.2 10/04/2021   MCV 90.6 10/04/2021   MCH 29.6 10/04/2021   RDW 12.8 10/04/2021   PLT 235 47/03/6282   Last metabolic panel Lab Results  Component Value Date   GLUCOSE 98 10/04/2021   NA 141 10/04/2021   K 4.4 10/04/2021   CL 105 10/04/2021   CO2 29 10/04/2021   BUN 14 10/04/2021   CREATININE 0.96 10/04/2021   GFRNONAA >60 10/04/2021   CALCIUM 8.8 (L) 10/04/2021   PROT 6.8 10/04/2021   ALBUMIN 3.3 (L) 10/04/2021   BILITOT 0.6 10/04/2021   ALKPHOS 63 10/04/2021   AST 21 10/04/2021   ALT 18 10/04/2021   ANIONGAP 7 10/04/2021   Last lipids Lab Results  Component Value Date   CHOL 159 01/15/2022   HDL 51 01/15/2022   LDLCALC 81 01/15/2022   TRIG 154 (H) 01/15/2022   CHOLHDL 3.1 01/15/2022   Last hemoglobin A1c Lab Results  Component Value Date   HGBA1C 5.8 (H) 01/15/2022      The 10-year ASCVD risk score (Arnett DK, et al., 2019) is: 9%    Assessment & Plan:   1. Smoking -He was encouraged on smoking cessation.  2. Prediabetes - His HgbA1c was 5.8%, he states that he will modify his diet. He was encouraged to continue on low carb/non concentrated sweet diet.  3. Health care maintenance - He was advised to complete [facility] financial application for  - Ambulatory referral to Gastroenterology for colonoscopy screening. - Testosterone; Future - Testosterone  4. Mass of left breast, unspecified quadrant - He was referred for diagnostic  - MM DIAG BREAST TOMO BILATERAL; Future    Return in about 9 weeks (around 04/17/2022), or if symptoms  worsen or fail to improve.    Britten Seyfried Jerold Coombe, NP

## 2022-02-14 ENCOUNTER — Other Ambulatory Visit: Payer: Self-pay

## 2022-02-14 ENCOUNTER — Ambulatory Visit (INDEPENDENT_AMBULATORY_CARE_PROVIDER_SITE_OTHER): Payer: Self-pay | Admitting: Urology

## 2022-02-14 ENCOUNTER — Telehealth: Payer: Self-pay

## 2022-02-14 ENCOUNTER — Encounter: Payer: Self-pay | Admitting: Urology

## 2022-02-14 VITALS — BP 148/67 | HR 76 | Ht 71.0 in | Wt 170.0 lb

## 2022-02-14 DIAGNOSIS — R39198 Other difficulties with micturition: Secondary | ICD-10-CM

## 2022-02-14 DIAGNOSIS — Z1211 Encounter for screening for malignant neoplasm of colon: Secondary | ICD-10-CM

## 2022-02-14 DIAGNOSIS — N529 Male erectile dysfunction, unspecified: Secondary | ICD-10-CM

## 2022-02-14 DIAGNOSIS — R399 Unspecified symptoms and signs involving the genitourinary system: Secondary | ICD-10-CM

## 2022-02-14 DIAGNOSIS — R339 Retention of urine, unspecified: Secondary | ICD-10-CM

## 2022-02-14 LAB — URINALYSIS, COMPLETE
Bilirubin, UA: NEGATIVE
Glucose, UA: NEGATIVE
Ketones, UA: NEGATIVE
Leukocytes,UA: NEGATIVE
Nitrite, UA: NEGATIVE
Protein,UA: NEGATIVE
RBC, UA: NEGATIVE
Specific Gravity, UA: 1.02 (ref 1.005–1.030)
Urobilinogen, Ur: 1 mg/dL (ref 0.2–1.0)
pH, UA: 6 (ref 5.0–7.5)

## 2022-02-14 LAB — MICROSCOPIC EXAMINATION: Bacteria, UA: NONE SEEN

## 2022-02-14 LAB — TESTOSTERONE: Testosterone: 500 ng/dL (ref 264–916)

## 2022-02-14 MED ORDER — TADALAFIL 5 MG PO TABS
5.0000 mg | ORAL_TABLET | Freq: Every day | ORAL | 11 refills | Status: DC
  Filled 2022-02-14: qty 30, 30d supply, fill #0
  Filled 2022-03-25: qty 30, 30d supply, fill #1

## 2022-02-14 MED ORDER — GOLYTELY 236 G PO SOLR
ORAL | 0 refills | Status: DC
  Filled 2022-02-14: qty 4000, 1d supply, fill #0

## 2022-02-14 NOTE — Progress Notes (Signed)
   02/14/22 2:29 PM   Diamantina Monks [birth year] [id number]  CC: Lower urinary tract symptoms, ED, PSA screening  HPI: 56 year old male here today for the above issues.  He reports at least a few months of some mild urinary symptoms with some lower abdominal pressure in the morning before voiding, and some spraying stream occasionally with urination.  He denies any significant nocturia.  Denies any dysuria or gross hematuria.  Denies any prior UTIs or retention.  He also has had some problems with erections over the last few months with difficulty maintaining.  He has never tried medications for this previously.  Urinalysis today is completely benign, PVR is normal at 0 mL, recent testosterone normal at 500, PSA normal at 1.5.   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   Broken tooth 01/15/2022   Incomplete bladder emptying 01/15/2022   Lump of left breast 02/13/2022   Prediabetes 02/13/2022   Substance abuse ([place])    drug    Surgical History: Past Surgical History:  Procedure Laterality Date   BUNIONECTOMY Bilateral    ~30 years ago   KNEE ARTHROSCOPY Left    ~when patient was [age]     Family History: Family History  Problem Relation Age of Onset   Hypertension Mother    Diabetes Mother    Kidney failure Mother    Other Father        "gum disease"   Hypertension Sister    Hypertension Sister    Other Maternal Grandmother        unknown medical history   Dementia Maternal Grandfather    Other Paternal Grandmother        unknown medical history   Other Paternal Grandfather        unknown medical history   Prostate cancer Neg Hx    Bladder Cancer Neg Hx    Kidney cancer Neg Hx     Social History:  reports that he has been smoking cigarettes. He has a 18.50 pack-year smoking history. He has never used smokeless tobacco. He reports that he does not currently use alcohol. He reports that he does not currently use drugs after having used the  following drugs: Marijuana, Cocaine, "Crack" cocaine, and Methamphetamines.  Physical Exam: BP (!) 148/67   Pulse 76   Ht '5\' 11"'$  (1.803 m)   Wt 170 lb (77.1 kg)   BMI 23.71 kg/m    Constitutional:  Alert and oriented, No acute distress. Cardiovascular: No clubbing, cyanosis, or edema. Respiratory: Normal respiratory effort, no increased work of breathing. GI: Abdomen is soft, nontender, nondistended, no abdominal masses  Laboratory Data: Reviewed, see HPI  Assessment & Plan:   56 year old male with mild urinary symptoms of lower abdominal pressure and some spraying stream especially in the morning as well as ED.  Urinalysis benign, emptying completely, normal PSA.  We discussed options at length and using shared decision making he opted for trial of Cialis 5 mg daily to address both his mild BPH symptoms as well as his ED.  Risk and benefits were discussed.  Trial of Cialis 5 mg daily for ED and BPH, RTC 6 weeks symptom check   Philip Madrid, MD 02/14/2022  [facility] [address], [place], [place] [address] [phone]

## 2022-02-14 NOTE — Telephone Encounter (Signed)
Gastroenterology Pre-Procedure Review  Request Date: 03/20/22 Requesting Physician: Dr. Vicente Males  PATIENT REVIEW QUESTIONS: The patient responded to the following health history questions as indicated:    1. Are you having any GI issues? no 2. Do you have a personal history of Polyps? no 3. Do you have a family history of Colon Cancer or Polyps? no 4. Diabetes Mellitus? no 5. Joint replacements in the past 12 months?no 6. Major health problems in the past 3 months?no 7. Any artificial heart valves, MVP, or defibrillator?no    MEDICATIONS & ALLERGIES:    Patient reports the following regarding taking any anticoagulation/antiplatelet therapy:   Plavix, Coumadin, Eliquis, Xarelto, Lovenox, Pradaxa, Brilinta, or Effient? no Aspirin? no  Patient confirms/reports the following medications:  No current outpatient medications on file.   No current facility-administered medications for this visit.    Patient confirms/reports the following allergies:  No Known Allergies  No orders of the defined types were placed in this encounter.   AUTHORIZATION INFORMATION Primary Insurance: 1D#: Group #:  Secondary Insurance: 1D#: Group #:  SCHEDULE INFORMATION: Date: 03/20/22 Time: Location: [facility]

## 2022-02-18 ENCOUNTER — Other Ambulatory Visit: Payer: Self-pay

## 2022-03-07 ENCOUNTER — Ambulatory Visit
Admission: RE | Admit: 2022-03-07 | Discharge: 2022-03-07 | Disposition: A | Discharge status: Home / Self Care | Payer: Self-pay | Source: Ambulatory Visit | Attending: Gerontology | Admitting: Gerontology

## 2022-03-07 DIAGNOSIS — N6320 Unspecified lump in the left breast, unspecified quadrant: Secondary | ICD-10-CM | POA: Insufficient documentation

## 2022-03-19 ENCOUNTER — Encounter: Payer: Self-pay | Admitting: Gastroenterology

## 2022-03-20 ENCOUNTER — Encounter
Admission: RE | Disposition: A | Discharge status: Home / Self Care | Payer: Self-pay | Source: Home / Self Care | Attending: Gastroenterology

## 2022-03-20 ENCOUNTER — Ambulatory Visit: Payer: Self-pay | Admitting: Certified Registered"

## 2022-03-20 ENCOUNTER — Ambulatory Visit
Admission: RE | Admit: 2022-03-20 | Discharge: 2022-03-20 | Disposition: A | Discharge status: Home / Self Care | Payer: Self-pay | Attending: Gastroenterology | Admitting: Gastroenterology

## 2022-03-20 ENCOUNTER — Encounter: Payer: Self-pay | Admitting: Gastroenterology

## 2022-03-20 DIAGNOSIS — D122 Benign neoplasm of ascending colon: Secondary | ICD-10-CM | POA: Insufficient documentation

## 2022-03-20 DIAGNOSIS — D126 Benign neoplasm of colon, unspecified: Secondary | ICD-10-CM

## 2022-03-20 DIAGNOSIS — Z7689 Persons encountering health services in other specified circumstances: Secondary | ICD-10-CM

## 2022-03-20 DIAGNOSIS — Z1211 Encounter for screening for malignant neoplasm of colon: Secondary | ICD-10-CM

## 2022-03-20 DIAGNOSIS — D128 Benign neoplasm of rectum: Secondary | ICD-10-CM | POA: Insufficient documentation

## 2022-03-20 DIAGNOSIS — F17210 Nicotine dependence, cigarettes, uncomplicated: Secondary | ICD-10-CM | POA: Insufficient documentation

## 2022-03-20 HISTORY — PX: COLONOSCOPY WITH PROPOFOL: SHX5780

## 2022-03-20 LAB — URINE DRUG SCREEN, QUALITATIVE (ARMC ONLY)
Amphetamines, Ur Screen: NOT DETECTED
Barbiturates, Ur Screen: NOT DETECTED
Benzodiazepine, Ur Scrn: NOT DETECTED
Cannabinoid 50 Ng, Ur ~~LOC~~: NOT DETECTED
Cocaine Metabolite,Ur ~~LOC~~: NOT DETECTED
MDMA (Ecstasy)Ur Screen: NOT DETECTED
Methadone Scn, Ur: NOT DETECTED
Opiate, Ur Screen: NOT DETECTED
Phencyclidine (PCP) Ur S: NOT DETECTED
Tricyclic, Ur Screen: NOT DETECTED

## 2022-03-20 SURGERY — COLONOSCOPY WITH PROPOFOL
Anesthesia: General

## 2022-03-20 MED ORDER — DEXMEDETOMIDINE HCL IN NACL 200 MCG/50ML IV SOLN
INTRAVENOUS | Status: DC | PRN
  Administered 2022-03-20: 12 ug via INTRAVENOUS

## 2022-03-20 MED ORDER — LIDOCAINE HCL (CARDIAC) PF 100 MG/5ML IV SOSY
PREFILLED_SYRINGE | INTRAVENOUS | Status: DC | PRN
  Administered 2022-03-20: 100 mg via INTRAVENOUS

## 2022-03-20 MED ORDER — PROPOFOL 10 MG/ML IV BOLUS
INTRAVENOUS | Status: DC | PRN
  Administered 2022-03-20: 30 mg via INTRAVENOUS
  Administered 2022-03-20: 70 mg via INTRAVENOUS

## 2022-03-20 MED ORDER — SODIUM CHLORIDE 0.9 % IV SOLN
INTRAVENOUS | Status: DC
  Administered 2022-03-20: 20 mL/h via INTRAVENOUS

## 2022-03-20 MED ORDER — PROPOFOL 500 MG/50ML IV EMUL
INTRAVENOUS | Status: DC | PRN
  Administered 2022-03-20: 175 ug/kg/min via INTRAVENOUS

## 2022-03-20 MED ORDER — EPHEDRINE SULFATE (PRESSORS) 50 MG/ML IJ SOLN
INTRAMUSCULAR | Status: DC | PRN
  Administered 2022-03-20: 10 mg via INTRAVENOUS
  Administered 2022-03-20: 5 mg via INTRAVENOUS
  Administered 2022-03-20: 10 mg via INTRAVENOUS

## 2022-03-20 NOTE — Anesthesia Procedure Notes (Signed)
Procedure Name: General with mask airway Date/Time: 03/20/2022 11:42 AM  Performed by: Kelton Pillar, CRNAPre-anesthesia Checklist: Patient identified, Emergency Drugs available and Suction available Patient Re-evaluated:Patient Re-evaluated prior to induction Oxygen Delivery Method: Simple face mask Induction Type: IV induction Placement Confirmation: positive ETCO2, CO2 detector and breath sounds checked- equal and bilateral Dental Injury: Teeth and Oropharynx as per pre-operative assessment

## 2022-03-20 NOTE — Anesthesia Preprocedure Evaluation (Signed)
Anesthesia Evaluation  Patient identified by MRN, date of birth, ID band Patient awake    Reviewed: Allergy & Precautions, NPO status , Patient's Chart, lab work & pertinent test results  History of Anesthesia Complications Negative for: history of anesthetic complications  Airway Mallampati: II  TM Distance: >3 FB Neck ROM: Full    Dental  (+) Missing,    Pulmonary neg pulmonary ROS, Current SmokerPatient did not abstain from smoking.,    Pulmonary exam normal breath sounds clear to auscultation       Cardiovascular negative cardio ROS Normal cardiovascular exam Rhythm:Regular Rate:Normal     Neuro/Psych History of polysubstance abuse, last used April 2023, currently living in a recovery house negative neurological ROS  negative psych ROS   GI/Hepatic negative GI ROS, Neg liver ROS,   Endo/Other  negative endocrine ROS  Renal/GU negative Renal ROS  negative genitourinary   Musculoskeletal negative musculoskeletal ROS (+)   Abdominal   Peds negative pediatric ROS (+)  Hematology negative hematology ROS (+)   Anesthesia Other Findings   Reproductive/Obstetrics negative OB ROS                             Anesthesia Physical Anesthesia Plan  ASA: 2  Anesthesia Plan: General   Post-op Pain Management: Minimal or no pain anticipated   Induction: Intravenous  PONV Risk Score and Plan: 0 and Propofol infusion  Airway Management Planned: Natural Airway and Nasal Cannula  Additional Equipment:   Intra-op Plan:   Post-operative Plan:   Informed Consent: I have reviewed the patients History and Physical, chart, labs and discussed the procedure including the risks, benefits and alternatives for the proposed anesthesia with the patient or authorized representative who has indicated his/her understanding and acceptance.     Dental Advisory Given  Plan Discussed with:  Anesthesiologist, CRNA and Surgeon  Anesthesia Plan Comments: (Patient consented for risks of anesthesia including but not limited to:  - adverse reactions to medications - risk of airway placement if required - damage to eyes, teeth, lips or other oral mucosa - nerve damage due to positioning  - sore throat or hoarseness - Damage to heart, brain, nerves, lungs, other parts of body or loss of life  Patient voiced understanding.)        Anesthesia Quick Evaluation

## 2022-03-20 NOTE — Anesthesia Postprocedure Evaluation (Signed)
Anesthesia Post Note  Patient: Philip Collins  Procedure(s) Performed: COLONOSCOPY WITH PROPOFOL  Patient location during evaluation: Endoscopy Anesthesia Type: General Level of consciousness: awake and alert Pain management: pain level controlled Vital Signs Assessment: post-procedure vital signs reviewed and stable Respiratory status: spontaneous breathing, nonlabored ventilation, respiratory function stable and patient connected to nasal cannula oxygen Cardiovascular status: blood pressure returned to baseline and stable Postop Assessment: no apparent nausea or vomiting Anesthetic complications: no   No notable events documented.   Last Vitals:  Vitals:   03/20/22 1211 03/20/22 1221  BP: (!) 95/52 (!) 93/58  Pulse: 73 61  Resp: (!) 23 15  Temp:    SpO2: 100% 100%    Last Pain:  Vitals:   03/20/22 1221  TempSrc:   PainSc: (P) 0-No pain                 Ilene Qua

## 2022-03-20 NOTE — H&P (Signed)
Jonathon Bellows, MD [address] [place], [place], [place], [address] [address], [place], [place], [place], [address] Phone: [phone]  Fax: [phone]  Primary Care Physician:  Langston Reusing, NP   Pre-Procedure History & Physical: HPI:  Philip Collins is a 56 y.o. male is here for an colonoscopy.   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 Authorizing Provider  polyethylene glycol (GOLYTELY) 236 g solution Use as directed on colonoscopy instructions received in mail by office. 02/14/22  Yes Jonathon Bellows, MD  tadalafil (CIALIS) 5 MG tablet Take 1 tablet (5 mg total) by mouth daily. 02/14/22  Yes Billey Co, MD    Allergies as of 02/14/2022   (No Known Allergies)    Family History  Problem Relation Age of Onset   Hypertension Mother    Diabetes Mother    Kidney failure Mother    Other Father        "gum disease"   Hypertension Sister    Hypertension Sister    Other Maternal Grandmother        unknown medical history   Dementia Maternal Grandfather    Other Paternal Grandmother        unknown medical history   Other Paternal Grandfather        unknown medical history   Prostate cancer Neg Hx    Bladder Cancer Neg Hx    Kidney cancer Neg Hx    Breast cancer Neg Hx     Social History   Socioeconomic History   Marital status: Divorced    Spouse name: Not on file   Number of children: Not on file   Years of education: Not on file   Highest education level: Not on file  Occupational History   Not on file  Tobacco Use   Smoking status:  Every Day    Packs/day: 0.50    Years: 37.00    Total pack years: 18.50    Types: Cigarettes   Smokeless tobacco: Never   Tobacco comments:    Patient is at [facility] since 10/10/21, previous to that he was homeless  Vaping Use   Vaping Use: Never used  Substance and Sexual Activity   Alcohol use: Not Currently    Comment: occassional, last use 10/10/21   Drug use: Not Currently    Types: Marijuana, Cocaine, "Crack" cocaine, Methamphetamines    Comment: last use 10/10/21   Sexual activity: Not on file  Other Topics Concern   Not on file  Social History Narrative   Not on file   Social Determinants of Health   Financial Resource Strain: Not on file  Food Insecurity: Food Insecurity Present (01/15/2022)   Hunger Vital Sign    Worried About Running Out of Food in the Last Year: Often true    Ran Out of Food in the Last Year: Often true  Transportation Needs: No Transportation Needs (01/15/2022)   PRAPARE - Transportation    Lack of Transportation (Medical): No    Lack  of Transportation (Non-Medical): No  Physical Activity: Not on file  Stress: Not on file  Social Connections: Not on file  Intimate Partner Violence: Not on file    Review of Systems: See HPI, otherwise negative ROS  Physical Exam: BP 121/84   Pulse 62   Temp (!) 97.2 F (36.2 C) (Temporal)   Resp 20   Ht 6' (1.829 m)   Wt 77.1 kg   SpO2 100%   BMI 23.06 kg/m  General:   Alert,  pleasant and cooperative in NAD Head:  Normocephalic and atraumatic. Neck:  Supple; no masses or thyromegaly. Lungs:  Clear throughout to auscultation, normal respiratory effort.    Heart:  +S1, +S2, Regular rate and rhythm, No edema. Abdomen:  Soft, nontender and nondistended. Normal bowel sounds, without guarding, and without rebound.   Neurologic:  Alert and  oriented x4;  grossly normal neurologically.  Impression/Plan: Philip Collins is here for an colonoscopy to be performed for Screening colonoscopy average risk   Risks,  benefits, limitations, and alternatives regarding  colonoscopy have been reviewed with the patient.  Questions have been answered.  All parties agreeable.   Jonathon Bellows, MD  03/20/2022, 9:53 AM

## 2022-03-20 NOTE — Transfer of Care (Signed)
Immediate Anesthesia Transfer of Care Note  Patient: Philip Collins  Procedure(s) Performed: COLONOSCOPY WITH PROPOFOL  Patient Location: Endoscopy Unit  Anesthesia Type:General  Level of Consciousness: awake, drowsy and patient cooperative  Airway & Oxygen Therapy: Patient Spontanous Breathing and Patient connected to face mask oxygen  Post-op Assessment: Report given to RN and Post -op Vital signs reviewed and stable  Post vital signs: Reviewed and stable  Last Vitals:  Vitals Value Taken Time  BP 95/46 03/20/22 1151  Temp 36.1 C 03/20/22 1151  Pulse 65 03/20/22 1151  Resp 22 03/20/22 1152  SpO2 99 % 03/20/22 1151  Vitals shown include unvalidated device data.  Last Pain:  Vitals:   03/20/22 1151  TempSrc: Temporal  PainSc: Asleep         Complications: No notable events documented.

## 2022-03-20 NOTE — Op Note (Signed)
[facility] Patient Name: Philip Collins Procedure Date: 03/20/2022 11:29 AM MRN: [id number] Account #: [id number] Date of Birth: 1966-02-12 Admit Type: Outpatient Age: 56 Room: [facility] ENDO ROOM 2 Gender: Male Note Status: Finalized Instrument Name: Park Meo [id number] Procedure:             Colonoscopy Indications:           Screening for colorectal malignant neoplasm Providers:             Jonathon Bellows MD, MD Medicines:             Monitored Anesthesia Care Complications:         No immediate complications. Procedure:             Pre-Anesthesia Assessment:                        - Prior to the procedure, a History and Physical was                         performed, and patient medications, allergies and                         sensitivities were reviewed. The patient's tolerance                         of previous anesthesia was reviewed.                        - The risks and benefits of the procedure and the                         sedation options and risks were discussed with the                         patient. All questions were answered and informed                         consent was obtained.                        - ASA Grade Assessment: II - A patient with mild                         systemic disease.                        After obtaining informed consent, the colonoscope was                         passed under direct vision. Throughout the procedure,                         the patient's blood pressure, pulse, and oxygen                         saturations were monitored continuously. The                         Colonoscope was introduced through the anus and  advanced to the the cecum, identified by the                         appendiceal orifice. The colonoscopy was performed                         with ease. The patient tolerated the procedure well.                         The quality of the bowel preparation  was excellent. Findings:      The perianal and digital rectal examinations were normal.      Two sessile polyps were found in the rectum and ascending colon. The       polyps were 5 to 8 mm in size. These polyps were removed with a cold       snare. Resection and retrieval were complete.      Two sessile polyps were found in the ascending colon. The polyps were 3       to 4 mm in size. These polyps were removed with a cold biopsy forceps.       Resection and retrieval were complete.      The exam was otherwise without abnormality on direct and retroflexion       views. Impression:            - Two 5 to 8 mm polyps in the rectum and in the                         ascending colon, removed with a cold snare. Resected                         and retrieved.                        - Two 3 to 4 mm polyps in the ascending colon, removed                         with a cold biopsy forceps. Resected and retrieved.                        - The examination was otherwise normal on direct and                         retroflexion views. Recommendation:        - Discharge patient to home (with escort).                        - Resume previous diet.                        - Continue present medications.                        - Await pathology results.                        - Repeat colonoscopy for surveillance based on                         pathology results. Procedure Code(s):     ---  Professional ---                        [phone], Colonoscopy, flexible; with removal of                         tumor(s), polyp(s), or other lesion(s) by snare                         technique                        45380, 59, Colonoscopy, flexible; with biopsy, single                         or multiple Diagnosis Code(s):     --- Professional ---                        Z12.11, Encounter for screening for malignant neoplasm                         of colon                        K62.1, Rectal polyp                         K63.5, Polyp of colon CPT copyright 2019 American Medical Association. All rights reserved. The codes documented in this report are preliminary and upon coder review may  be revised to meet current compliance requirements. Jonathon Bellows, MD Jonathon Bellows MD, MD 03/20/2022 11:49:25 AM This report has been signed electronically. Number of Addenda: 0 Note Initiated On: 03/20/2022 11:29 AM Scope Withdrawal Time: 0 hours 10 minutes 45 seconds  Total Procedure Duration: 0 hours 14 minutes 20 seconds  Estimated Blood Loss:  Estimated blood loss: none.      [facility]

## 2022-03-20 NOTE — OR Nursing (Signed)
Anesthesiologist aware that transport for [facility] will pick patient up post procedure and take him to treatment center.

## 2022-03-21 ENCOUNTER — Encounter: Payer: Self-pay | Admitting: Gastroenterology

## 2022-03-21 LAB — SURGICAL PATHOLOGY

## 2022-03-25 ENCOUNTER — Other Ambulatory Visit: Payer: Self-pay

## 2022-04-03 ENCOUNTER — Encounter: Payer: Self-pay | Admitting: Urology

## 2022-04-03 ENCOUNTER — Other Ambulatory Visit: Payer: Self-pay

## 2022-04-03 ENCOUNTER — Ambulatory Visit (INDEPENDENT_AMBULATORY_CARE_PROVIDER_SITE_OTHER): Payer: Self-pay | Admitting: Urology

## 2022-04-03 VITALS — BP 116/73 | HR 66 | Ht 72.0 in | Wt 170.0 lb

## 2022-04-03 DIAGNOSIS — N401 Enlarged prostate with lower urinary tract symptoms: Secondary | ICD-10-CM

## 2022-04-03 DIAGNOSIS — N529 Male erectile dysfunction, unspecified: Secondary | ICD-10-CM

## 2022-04-03 DIAGNOSIS — N138 Other obstructive and reflux uropathy: Secondary | ICD-10-CM

## 2022-04-03 MED ORDER — TADALAFIL 10 MG PO TABS
10.0000 mg | ORAL_TABLET | Freq: Every day | ORAL | 11 refills | Status: DC
  Filled 2022-04-03: qty 30, 30d supply, fill #0

## 2022-04-03 NOTE — Progress Notes (Signed)
   04/03/2022 2:36 PM   Philip Collins 1965/11/26 [id number]  Reason for visit: Follow up BPH/LUTS, ED, PSA screening  HPI: 56 year old male who presented in August 2023 with lower abdominal pressure in the morning before voiding, some spraying stream, as well as problems with erections.  Urinalysis was benign, PVR normal at 50m, testosterone normal at 500, and PSA normal at 1.5.  He opted for trial of Cialis 5 mg daily.  He has noticed significant improvement on this medication with improvement in the urination and lower abdominal pain in the morning, as well as improvement in the erections.  He still thinks the erections could be a little bit better, and is interested in increasing the dose to 10 mg daily.  He had some discomfort in the left scrotal skin and some itching but that seemed to resolve with some over-the-counter lotion.  On exam today there is a small amount of skin thickening in the left scrotum but no evidence of cellulitis, erythema, or infection.  His only other complaint is some straining with ejaculations.  We discussed possible other etiologies including urethral stricture, and could consider cystoscopy in the future if worsening symptoms.  -Cialis increased to 10 mg daily for BPH and ED -Consider cystoscopy in the future if worsening urinary symptoms or ejaculatory concerns, or microscopic hematuria -RTC 4 to 6 months PVR, UA, symptom check  BBilley Co MD  [address] [place] [phone]

## 2022-04-03 NOTE — Patient Instructions (Signed)
Cialis increased to '10mg'$  daily

## 2022-04-16 ENCOUNTER — Ambulatory Visit: Payer: Medicaid Other | Admitting: Gerontology

## 2022-04-23 ENCOUNTER — Other Ambulatory Visit: Payer: Self-pay

## 2022-04-23 ENCOUNTER — Encounter: Payer: Self-pay | Admitting: Gerontology

## 2022-04-23 ENCOUNTER — Ambulatory Visit: Payer: Medicaid Other | Admitting: Gerontology

## 2022-04-23 VITALS — BP 103/69 | HR 66 | Temp 98.1°F | Resp 16 | Ht 72.0 in | Wt 169.5 lb

## 2022-04-23 DIAGNOSIS — R7303 Prediabetes: Secondary | ICD-10-CM

## 2022-04-23 LAB — POCT GLYCOSYLATED HEMOGLOBIN (HGB A1C): Hemoglobin A1C: 5.9 % — AB (ref 4.0–5.6)

## 2022-04-23 LAB — GLUCOSE, POCT (MANUAL RESULT ENTRY): POC Glucose: 115 mg/dl — AB (ref 70–99)

## 2022-04-23 NOTE — Progress Notes (Signed)
Established Patient Office Visit  Subjective   Patient ID: Philip Collins, male    DOB: 1966-03-03  Age: 56 y.o. MRN: [id number]  Chief Complaint  Patient presents with   Follow-up    Labs drawn 02/13/22   Prediabetes    HPI  Philip Collins is a 56 y/o male with no significant past medical history who presents for a follow-up and lab review. His labs done on 02/13/22 and 02/14/22 were unremarkable. He had a mammogram performed on 03/07/22, which revealed benign gynecomastia. He also had a colonoscopy on 03/20/22. Multiple polyps were removed from his rectum and ascending colon, but pathology was performed and there was no evidence of malignancy. He was diagnosed with prediabetes on his last visit to the clinic on 02/13/22. His A1c was 5.8% at that time and he elected to improve his diet and reduce carbs for A1c reduction measures. Today, his blood glucose in the office is 115 mg/dL and his A1c is 5.9%. He states that he has stopped drinking caffeine and sugary beverages, and that he has reduced his bread intake. However, he is still occasionally eating high carb/sugar foods such as pasta and ice cream. He states that he is slowly trying to cut back on these foods. He is slowly cutting back on his smoking as well; he is down from one pack to a half pack daily. He saw urology on 04/03/22, and his Cialis was increased to 10 mg daily for erectile dysfunction and possible BPH. He states that he is likely going to stop taking this medication as he does not need the benefits for ED and he is drinking more water to improve his urinary flow. Overall, he states he is doing well, his mood is good, and he offers no further complaint.   Review of Systems  Constitutional: Negative.   HENT: Negative.    Eyes: Negative.   Respiratory: Negative.    Cardiovascular: Negative.   Gastrointestinal: Negative.   Genitourinary: Negative.   Musculoskeletal: Negative.   Neurological: Negative.   Psychiatric/Behavioral: Negative.         Objective:     BP 103/69 (BP Location: Right Arm, Patient Position: Sitting, Cuff Size: Large)   Pulse 66   Temp 98.1 F (36.7 C) (Oral)   Resp 16   Ht 6' (1.829 m)   Wt 169 lb 8 oz (76.9 kg)   SpO2 98%   BMI 22.99 kg/m  BP Readings from Last 3 Encounters:  04/23/22 103/69  04/03/22 116/73  03/20/22 (!) 93/58   Wt Readings from Last 3 Encounters:  04/23/22 169 lb 8 oz (76.9 kg)  04/03/22 170 lb (77.1 kg)  03/20/22 170 lb (77.1 kg)      Physical Exam Constitutional:      Appearance: Normal appearance. He is normal weight.  HENT:     Head: Normocephalic and atraumatic.  Eyes:     Conjunctiva/sclera: Conjunctivae normal.  Cardiovascular:     Rate and Rhythm: Normal rate and regular rhythm.     Heart sounds: Normal heart sounds.  Pulmonary:     Effort: Pulmonary effort is normal.     Breath sounds: Normal breath sounds.  Skin:    General: Skin is warm and dry.  Neurological:     General: No focal deficit present.     Mental Status: He is alert and oriented to person, place, and time. Mental status is at baseline.  Psychiatric:        Mood and Affect: Mood normal.  Behavior: Behavior normal.        Thought Content: Thought content normal.        Judgment: Judgment normal.      Results for orders placed or performed in visit on 04/23/22  POCT HgB A1C  Result Value Ref Range   Hemoglobin A1C 5.9 (A) 4.0 - 5.6 %   HbA1c POC (<> result, manual entry)     HbA1c, POC (prediabetic range)     HbA1c, POC (controlled diabetic range)    POCT Glucose (CBG)  Result Value Ref Range   POC Glucose 115 (A) 70 - 99 mg/dl    Last CBC Lab Results  Component Value Date   WBC 5.3 10/04/2021   HGB 14.1 10/04/2021   HCT 43.2 10/04/2021   MCV 90.6 10/04/2021   MCH 29.6 10/04/2021   RDW 12.8 10/04/2021   PLT 235 82/42/3536   Last metabolic panel Lab Results  Component Value Date   GLUCOSE 98 10/04/2021   NA 141 10/04/2021   K 4.4 10/04/2021   CL 105  10/04/2021   CO2 29 10/04/2021   BUN 14 10/04/2021   CREATININE 0.96 10/04/2021   GFRNONAA >60 10/04/2021   CALCIUM 8.8 (L) 10/04/2021   PROT 6.8 10/04/2021   ALBUMIN 3.3 (L) 10/04/2021   BILITOT 0.6 10/04/2021   ALKPHOS 63 10/04/2021   AST 21 10/04/2021   ALT 18 10/04/2021   ANIONGAP 7 10/04/2021   Last lipids Lab Results  Component Value Date   CHOL 159 01/15/2022   HDL 51 01/15/2022   LDLCALC 81 01/15/2022   TRIG 154 (H) 01/15/2022   CHOLHDL 3.1 01/15/2022   Last hemoglobin A1c Lab Results  Component Value Date   HGBA1C 5.9 (A) 04/23/2022      The 10-year ASCVD risk score (Arnett DK, et al., 2019) is: 7.5%    Assessment & Plan:    Prediabetes - Encouraged him to continue reducing carbs and sweets in his diet to improve his A1c. He plans to completely eliminate caffeine and further decrease his carb/sweet intake.  Return in about 3 months (around 07/24/2022), or if symptoms worsen or fail to improve.    Odelia Gage

## 2022-04-23 NOTE — Patient Instructions (Signed)

## 2022-07-15 ENCOUNTER — Other Ambulatory Visit: Payer: Self-pay

## 2022-07-24 ENCOUNTER — Ambulatory Visit: Payer: Medicaid Other | Admitting: Gerontology

## 2022-08-01 ENCOUNTER — Encounter: Payer: Self-pay | Admitting: Urology

## 2022-08-01 ENCOUNTER — Ambulatory Visit: Payer: Medicaid Other | Admitting: Urology

## 2022-08-16 ENCOUNTER — Ambulatory Visit: Payer: Self-pay | Admitting: Nurse Practitioner

## 2022-09-20 ENCOUNTER — Ambulatory Visit (INDEPENDENT_AMBULATORY_CARE_PROVIDER_SITE_OTHER): Payer: Medicaid Other | Admitting: Nurse Practitioner

## 2022-09-20 ENCOUNTER — Encounter: Payer: Self-pay | Admitting: Nurse Practitioner

## 2022-09-20 VITALS — BP 114/72 | HR 61 | Temp 97.9°F | Ht 71.0 in | Wt 162.4 lb

## 2022-09-20 DIAGNOSIS — R7303 Prediabetes: Secondary | ICD-10-CM | POA: Diagnosis not present

## 2022-09-20 DIAGNOSIS — Z716 Tobacco abuse counseling: Secondary | ICD-10-CM

## 2022-09-20 DIAGNOSIS — N138 Other obstructive and reflux uropathy: Secondary | ICD-10-CM

## 2022-09-20 DIAGNOSIS — N401 Enlarged prostate with lower urinary tract symptoms: Secondary | ICD-10-CM | POA: Diagnosis not present

## 2022-09-20 DIAGNOSIS — Z9189 Other specified personal risk factors, not elsewhere classified: Secondary | ICD-10-CM | POA: Diagnosis not present

## 2022-09-20 DIAGNOSIS — K649 Unspecified hemorrhoids: Secondary | ICD-10-CM | POA: Diagnosis not present

## 2022-09-20 NOTE — Assessment & Plan Note (Signed)
Has 1 bowel movement daily ,patient encouraged to eat plenty of fiber drink at least 64 ounces of water daily to prevent constipation. Continue Preparation H as needed He refused GI referral

## 2022-09-20 NOTE — Assessment & Plan Note (Signed)
Patient referred to the dentist

## 2022-09-20 NOTE — Assessment & Plan Note (Signed)
Patient denies any urinary frequency, hesitancy, incontinence He has stopped taking Cialis after 2 months. He was encouraged to follow-up as planned with urology

## 2022-09-20 NOTE — Assessment & Plan Note (Addendum)
Lab Results  Component Value Date   HGBA1C 5.9 (A) 04/23/2022   Loves ice cream. Patient counselled on low carb diet.  He was encouraged to engage in regular moderate exercises at least 150 minutes weekly

## 2022-09-20 NOTE — Progress Notes (Signed)
New Patient Office Visit  Subjective:  Patient ID: Philip Collins, male    DOB: 20-Dec-1965  Age: 57 y.o. MRN: [id number]  CC:  Chief Complaint  Patient presents with   Establish Care   Hemorrhoids    HPI Philip Collins is a 57 y.o. male  has a past medical history of BPH with obstruction/lower urinary tract symptoms, Bradycardia (01/15/2022), Broken tooth (01/15/2022), Erectile dysfunction, Incomplete bladder emptying (01/15/2022), Lump of left breast (02/13/2022), Prediabetes (02/13/2022), and Substance abuse ([place]).  Previous patient of [place] NP.   History of substance abuse . he is currently residing at [facility].  He has been in rehab for 11 months.   Current tobacco user.  He started smoking cigarettes since age 62, smokes less than 1 pack daily.  He denies wheezing, shortness of breath, cough.  We discussed screening for lung cancer but patient refused.  Hemorrhoids.  Patient reports that he sees blood spotting in his stool and his anus feels irritated sometimes.  He has been using Preparation H as needed.  We discussed referral to GI but patient declined.  He is up-to-date with colon cancer screening.  Due for flu vaccine, shingles vaccine, Tdap vaccine need for all vaccines discussed.  Patient refused vaccines.      Past Medical History:  Diagnosis Date   BPH with obstruction/lower urinary tract symptoms    Bradycardia 01/15/2022   Broken tooth 01/15/2022   Erectile dysfunction    Incomplete bladder emptying 01/15/2022   Lump of left breast 02/13/2022   Prediabetes 02/13/2022   Substance abuse ([place])    drug    Past Surgical History:  Procedure Laterality Date   BUNIONECTOMY Bilateral    ~30 years ago   COLONOSCOPY WITH PROPOFOL N/A 03/20/2022   Procedure: COLONOSCOPY WITH PROPOFOL;  Surgeon: Jonathon Bellows, MD;  [facility]: [facility] ENDOSCOPY;  Service: Gastroenterology;  Laterality: N/A;   KNEE ARTHROSCOPY Left    ~when patient was [age]    Family  History  Problem Relation Age of Onset   Hypertension Mother    Diabetes Mother    Kidney failure Mother    Other Father        "gum disease"   Hypertension Sister    Hypertension Sister    Other Maternal Grandmother        unknown medical history   Dementia Maternal Grandfather    Other Paternal Grandmother        unknown medical history   Other Paternal Grandfather        unknown medical history   Prostate cancer Neg Hx    Bladder Cancer Neg Hx    Kidney cancer Neg Hx    Breast cancer Neg Hx     Social History   Socioeconomic History   Marital status: Divorced    Spouse name: Not on file   Number of children: 4   Years of education: Not on file   Highest education level: Not on file  Occupational History   Not on file  Tobacco Use   Smoking status: Every Day    Packs/day: 0.50    Years: 37.00    Additional pack years: 0.00    Total pack years: 18.50    Types: Cigarettes    Passive exposure: Current   Smokeless tobacco: Never   Tobacco comments:    Patient is at [facility] since 10/10/21, previous to that he was homeless    Patient trying to quit smoking. Patient states he has bought  his last pack  Vaping Use   Vaping Use: Never used  Substance and Sexual Activity   Alcohol use: Not Currently    Comment: occassional, last use 10/09/21   Drug use: Not Currently    Types: Marijuana, Cocaine, "Crack" cocaine, Methamphetamines    Comment: last use 10/09/21   Sexual activity: Not Currently  Other Topics Concern   Not on file  Social History Narrative   Lives sober living [facility] [place].    Social Determinants of Health   Financial Resource Strain: Not on file  Food Insecurity: Food Insecurity Present (01/15/2022)   Hunger Vital Sign    Worried About Running Out of Food in the Last Year: Often true    Ran Out of Food in the Last Year: Often true  Transportation Needs: No Transportation Needs (01/15/2022)   PRAPARE - Hydrologist (Medical): No     Lack of Transportation (Non-Medical): No  Physical Activity: Not on file  Stress: Not on file  Social Connections: Not on file  Intimate Partner Violence: Not on file    ROS Review of Systems  Constitutional: Negative.   HENT:  Positive for dental problem. Negative for drooling, ear discharge, ear pain, facial swelling and hearing loss.   Eyes:  Negative for pain and discharge.  Respiratory: Negative.    Cardiovascular: Negative.   Genitourinary: Negative.   Musculoskeletal: Negative.   Skin: Negative.   Neurological: Negative.   Psychiatric/Behavioral: Negative.      Objective:   Today's Vitals: BP 114/72   Pulse 61   Temp 97.9 F (36.6 C)   Ht 5\' 11"  (1.803 m)   Wt 162 lb 6.4 oz (73.7 kg)   SpO2 100%   BMI 22.65 kg/m   Physical Exam Constitutional:      General: He is not in acute distress.    Appearance: Normal appearance. He is not ill-appearing, toxic-appearing or diaphoretic.  HENT:     Right Ear: Tympanic membrane, ear canal and external ear normal. There is no impacted cerumen.     Left Ear: Tympanic membrane, ear canal and external ear normal. There is no impacted cerumen.     Nose: Nose normal. No congestion or rhinorrhea.     Mouth/Throat:     Mouth: Mucous membranes are moist. No injury, lacerations, oral lesions or angioedema.     Dentition: No dental abscesses or gum lesions.     Pharynx: Oropharynx is clear. No oropharyngeal exudate, posterior oropharyngeal erythema or uvula swelling.     Comments: Poor dental hygiene, missing teeth, Eyes:     General: No scleral icterus.       Right eye: No discharge.        Left eye: No discharge.  Cardiovascular:     Rate and Rhythm: Normal rate and regular rhythm.     Pulses: Normal pulses.     Heart sounds: Normal heart sounds.  Pulmonary:     Effort: Pulmonary effort is normal. No respiratory distress.     Breath sounds: Normal breath sounds. No stridor. No wheezing, rhonchi or rales.  Chest:     Chest  wall: No tenderness.  Abdominal:     General: There is no distension.     Palpations: Abdomen is soft.     Tenderness: There is no abdominal tenderness. There is no guarding.  Musculoskeletal:        General: No deformity or signs of injury.     Right lower leg: No  edema.     Left lower leg: No edema.  Skin:    General: Skin is warm and dry.     Coloration: Skin is not jaundiced.     Findings: No bruising or erythema.  Neurological:     Mental Status: He is alert and oriented to person, place, and time.     Motor: No weakness.     Coordination: Coordination normal.     Gait: Gait normal.  Psychiatric:        Mood and Affect: Mood normal.        Behavior: Behavior normal.        Thought Content: Thought content normal.        Judgment: Judgment normal.     Assessment & Plan:   Problem List Items Addressed This Visit       Cardiovascular and Mediastinum   Hemorrhoids    Has 1 bowel movement daily ,patient encouraged to eat plenty of fiber drink at least 64 ounces of water daily to prevent constipation. Continue Preparation H as needed He refused GI referral        Genitourinary   BPH with obstruction/lower urinary tract symptoms    Patient denies any urinary frequency, hesitancy, incontinence He has stopped taking Cialis after 2 months. He was encouraged to follow-up as planned with urology        Other   Prediabetes - Primary    Lab Results  Component Value Date   HGBA1C 5.9 (A) 04/23/2022   Loves ice cream. Patient counselled on low carb diet.  He was encouraged to engage in regular moderate exercises at least 150 minutes weekly      Poor dental hygiene    Patient referred to the dentist      Relevant Orders   Ambulatory referral to Dentistry   Tobacco abuse counseling    Smokes about less than 1 pack/day  Asked about quitting: confirms that he/she currently smokes cigarettes Advise to quit smoking: Educated about QUITTING to reduce the risk of  cancer, cardio and cerebrovascular disease. Assess willingness: Unwilling to quit at this time, but is working on cutting back. Assist with counseling and pharmacotherapy: Counseled for 5 minutes and literature provided. Arrange for follow up: follow up in 4 months and continue to offer help.        Outpatient Encounter Medications as of 09/20/2022  Medication Sig   tadalafil (CIALIS) 10 MG tablet Take 1 tablet (10 mg total) by mouth daily. (Patient not taking: Reported on 09/20/2022)   No facility-administered encounter medications on file as of 09/20/2022.    Follow-up: Return in about 4 months (around 01/20/2023) for CPE.   Renee Rival, FNP

## 2022-09-20 NOTE — Patient Instructions (Signed)
1. Prediabetes   2. Hemorrhoids, unspecified hemorrhoid type   3. BPH with obstruction/lower urinary tract symptoms   4. Poor dental hygiene  - Ambulatory referral to Dentistry    It is important that you exercise regularly at least 30 minutes 5 times a week as tolerated  Think about what you will eat, plan ahead. Choose " clean, green, fresh or frozen" over canned, processed or packaged foods which are more sugary, salty and fatty. 70 to 75% of food eaten should be vegetables and fruit. Three meals at set times with snacks allowed between meals, but they must be fruit or vegetables. Aim to eat over a 12 hour period , example 7 am to 7 pm, and STOP after  your last meal of the day. Drink water,generally about 64 ounces per day, no other drink is as healthy. Fruit juice is best enjoyed in a healthy way, by EATING the fruit.  Thanks for choosing [facility] we consider it a privelige to serve you.

## 2022-09-20 NOTE — Assessment & Plan Note (Signed)
Smokes about less than 1 pack/day  Asked about quitting: confirms that he/she currently smokes cigarettes Advise to quit smoking: Educated about QUITTING to reduce the risk of cancer, cardio and cerebrovascular disease. Assess willingness: Unwilling to quit at this time, but is working on cutting back. Assist with counseling and pharmacotherapy: Counseled for 5 minutes and literature provided. Arrange for follow up: follow up in 4 months and continue to offer help.

## 2022-10-12 ENCOUNTER — Other Ambulatory Visit (HOSPITAL_COMMUNITY)
Admission: EM | Admit: 2022-10-12 | Discharge: 2022-10-19 | Disposition: A | Discharge status: Home / Self Care | Payer: No Typology Code available for payment source | Attending: Psychiatry | Admitting: Psychiatry

## 2022-10-12 DIAGNOSIS — F1994 Other psychoactive substance use, unspecified with psychoactive substance-induced mood disorder: Secondary | ICD-10-CM

## 2022-10-12 DIAGNOSIS — F1410 Cocaine abuse, uncomplicated: Secondary | ICD-10-CM | POA: Diagnosis present

## 2022-10-12 DIAGNOSIS — Z6372 Alcoholism and drug addiction in family: Secondary | ICD-10-CM | POA: Diagnosis not present

## 2022-10-12 DIAGNOSIS — F1010 Alcohol abuse, uncomplicated: Secondary | ICD-10-CM | POA: Diagnosis not present

## 2022-10-12 DIAGNOSIS — F1090 Alcohol use, unspecified, uncomplicated: Secondary | ICD-10-CM

## 2022-10-12 DIAGNOSIS — Z1152 Encounter for screening for COVID-19: Secondary | ICD-10-CM | POA: Insufficient documentation

## 2022-10-12 DIAGNOSIS — F1914 Other psychoactive substance abuse with psychoactive substance-induced mood disorder: Secondary | ICD-10-CM | POA: Insufficient documentation

## 2022-10-12 LAB — POC SARS CORONAVIRUS 2 AG: SARSCOV2ONAVIRUS 2 AG: NEGATIVE

## 2022-10-12 MED ORDER — TRAZODONE HCL 50 MG PO TABS
50.0000 mg | ORAL_TABLET | Freq: Every evening | ORAL | Status: DC | PRN
  Filled 2022-10-12 (×2): qty 1

## 2022-10-12 MED ORDER — ALUM & MAG HYDROXIDE-SIMETH 200-200-20 MG/5ML PO SUSP: 30.0000 mL | ORAL | Status: DC | PRN

## 2022-10-12 MED ORDER — LOPERAMIDE HCL 2 MG PO CAPS: 2.0000 mg | ORAL_CAPSULE | ORAL | Status: AC | PRN

## 2022-10-12 MED ORDER — METHOCARBAMOL 500 MG PO TABS: 500.0000 mg | ORAL_TABLET | Freq: Three times a day (TID) | ORAL | Status: DC | PRN

## 2022-10-12 MED ORDER — ACETAMINOPHEN 325 MG PO TABS: 650.0000 mg | ORAL_TABLET | Freq: Four times a day (QID) | ORAL | Status: DC | PRN

## 2022-10-12 MED ORDER — NAPROXEN 500 MG PO TABS: 500.0000 mg | ORAL_TABLET | Freq: Two times a day (BID) | ORAL | Status: DC | PRN

## 2022-10-12 MED ORDER — DICYCLOMINE HCL 20 MG PO TABS: 20.0000 mg | ORAL_TABLET | Freq: Four times a day (QID) | ORAL | Status: DC | PRN

## 2022-10-12 MED ORDER — HYDROXYZINE HCL 25 MG PO TABS
25.0000 mg | ORAL_TABLET | Freq: Four times a day (QID) | ORAL | Status: AC | PRN
  Filled 2022-10-12: qty 1

## 2022-10-12 MED ORDER — ONDANSETRON 4 MG PO TBDP: 4.0000 mg | ORAL_TABLET | Freq: Four times a day (QID) | ORAL | Status: AC | PRN

## 2022-10-12 MED ORDER — MAGNESIUM HYDROXIDE 400 MG/5ML PO SUSP: 30.0000 mL | Freq: Every day | ORAL | Status: DC | PRN

## 2022-10-12 NOTE — ED Provider Notes (Signed)
Facility Based Crisis Admission H&P  Date: 10/13/22 Patient Name: Jaimere Haslett MRN: [id number] Chief Complaint:Relapse on cocaine and alcohol  Diagnoses:  Final diagnoses:  Alcohol use disorder  Cocaine use disorder    HPI: Saajan Bagnoli is a 57 year old male with a psychiatric history of alcohol abuse and cocaine abuse presenting voluntarily to [facility].  Patient reports relapsing on crack cocaine and alcohol after being sober for 1 year and male is experiencing worsening depression and anxiety symptoms. Patient denies any SI HI or AVH.  Patient was assessed face-to-face by this provider.  Patient is alert oriented x 3, mood is anxious and depressed with a sad affect. Patient states that he has been 1 years sober on April 5 and that stating that he relapsed. Patient reports that he used about $500 worth of alcohol and crack cocaine. Patient states that he is unable to manage his money and in the past he had his sister as his payee but he would constantly ask her for the money. Patient reports that he was most recently at [facility] but did not want to stay there due to his relapse.  Patient reports he was previously working at the [employer].  Patient reports he is feeling depressed, "I want to give up". Patient states that he is tired of trying and he has been having difficulty handling his emotions.  Patient states that he wants help, "I can keep living this way".  Patient states abandonment issues from his biological father.  Patient states that he was raised by his mother and stepfather patient denies any current mental health providers, denies being on any mental health medications or receiving any outpatient therapy services.  Patient reports that he had treatment at [facility] for about 6 months and it ws very helpful.  Patient denies any SI/HI or AVH, paranoia or delusions does not appear to be responding to any internal or external stimuli.  Patient will be admitted to [facility]  for crisis management,safety and stabilization of alcohol use disorder and cocaine use disorder.  PHQ 2-9:  Flowsheet Row ED from 10/12/2022 in [facility] Office Visit from 09/20/2022 in [facility]  Thoughts that you would be better off dead, or of hurting yourself in some way Not at all Not at all  PHQ-9 Total Score 5 0       Flowsheet Row ED from 10/12/2022 in [facility] Admission (Discharged) from 03/20/2022 in [facility] ED from 10/04/2021 in [facility] at [facility]  C-SSRS RISK CATEGORY No Risk No Risk No Risk        Total Time spent with patient: 30 minutes  Musculoskeletal  Strength & Muscle Tone: within normal limits Gait & Station: normal Patient leans: N/A  Psychiatric Specialty Exam  Presentation General Appearance:  Appropriate for Environment  Eye Contact: Good  Speech: Clear and Coherent  Speech Volume: Normal  Handedness: Right   Mood and Affect  Mood: Depressed  Affect: Congruent   Thought Process  Thought Processes: Coherent  Descriptions of Associations:Intact  Orientation:Full (Time, Place and Person)  Thought Content:WDL  Diagnosis of Schizophrenia or Schizoaffective disorder in past: No   Hallucinations:Hallucinations: None  Ideas of Reference:None  Suicidal Thoughts:Suicidal Thoughts: No  Homicidal Thoughts:Homicidal Thoughts: No   Sensorium  Memory: Immediate Good; Recent Fair; Remote Good  Judgment: Fair  Insight: Fair   Executive Functions  Concentration: Good  Attention Span:  Good  Recall: Good  Fund of Knowledge: Good  Language: Good   Psychomotor Activity  Psychomotor Activity: Psychomotor Activity: Normal   Assets  Assets: Communication Skills; Desire for Improvement; Financial Resources/Insurance; Physical Health; Resilience   Sleep  Sleep: Sleep:  Fair Number of Hours of Sleep: -1   Nutritional Assessment (For OBS and FBC admissions only) Has the patient had a weight loss or gain of 10 pounds or more in the last 3 months?: No Has the patient had a decrease in food intake/or appetite?: No Does the patient have dental problems?: No Does the patient have eating habits or behaviors that may be indicators of an eating disorder including binging or inducing vomiting?: No Has the patient recently lost weight without trying?: 0 Has the patient been eating poorly because of a decreased appetite?: 0 Malnutrition Screening Tool Score: 0    Physical Exam Constitutional:      Appearance: Normal appearance.  HENT:     Head: Normocephalic and atraumatic.     Nose: Nose normal.  Eyes:     Pupils: Pupils are equal, round, and reactive to light.  Cardiovascular:     Rate and Rhythm: Normal rate.  Pulmonary:     Effort: Pulmonary effort is normal.  Abdominal:     General: Abdomen is flat.  Musculoskeletal:        General: Normal range of motion.     Cervical back: Normal range of motion.  Skin:    General: Skin is warm.  Neurological:     Mental Status: He is alert and oriented to person, place, and time.  Psychiatric:        Attention and Perception: Attention normal.        Mood and Affect: Mood is depressed.        Speech: Speech normal.        Behavior: Behavior is cooperative.        Thought Content: Thought content is not paranoid or delusional. Thought content does not include homicidal ideation. Thought content does not include homicidal plan.        Cognition and Memory: Cognition normal.        Judgment: Judgment is impulsive.    Review of Systems  Constitutional: Negative.   HENT: Negative.    Eyes: Negative.   Respiratory: Negative.    Cardiovascular: Negative.   Gastrointestinal: Negative.   Genitourinary: Negative.   Musculoskeletal: Negative.   Skin: Negative.   Neurological: Negative.    Endo/Heme/Allergies: Negative.   Psychiatric/Behavioral:  Positive for depression and substance abuse.     Blood pressure 116/68, pulse (!) 109, temperature 98.1 F (36.7 C), temperature source Oral, resp. rate 20, SpO2 100 %. There is no height or weight on file to calculate BMI.  Past Psychiatric History: [facility]  Is the patient at risk to self? No  Has the patient been a risk to self in the past 6 months? No .    Has the patient been a risk to self within the distant past? No   Is the patient a risk to others? No   Has the patient been a risk to others in the past 6 months? No   Has the patient been a risk to others within the distant past? No   Past Medical History: Denies any significant medical issues Family History: Father-alcoholism, Brother-alcoholism Social History: 57 y/o divorced, relapsed after being 1-year sober, works at the [employer] company   Last Labs:  Admission  on 10/12/2022  Component Date Value Ref Range Status   WBC 10/12/2022 6.3  4.0 - 10.5 K/uL Final   RBC 10/12/2022 5.26  4.22 - 5.81 MIL/uL Final   Hemoglobin 10/12/2022 16.4  13.0 - 17.0 g/dL Final   HCT 24/26/8341 46.4  39.0 - 52.0 % Final   MCV 10/12/2022 88.2  80.0 - 100.0 fL Final   MCH 10/12/2022 31.2  26.0 - 34.0 pg Final   MCHC 10/12/2022 35.3  30.0 - 36.0 g/dL Final   RDW 96/22/2979 12.2  11.5 - 15.5 % Final   Platelets 10/12/2022 209  150 - 400 K/uL Final   nRBC 10/12/2022 0.0  0.0 - 0.2 % Final   Neutrophils Relative % 10/12/2022 52  % Final   Neutro Abs 10/12/2022 3.3  1.7 - 7.7 K/uL Final   Lymphocytes Relative 10/12/2022 38  % Final   Lymphs Abs 10/12/2022 2.4  0.7 - 4.0 K/uL Final   Monocytes Relative 10/12/2022 8  % Final   Monocytes Absolute 10/12/2022 0.5  0.1 - 1.0 K/uL Final   Eosinophils Relative 10/12/2022 1  % Final   Eosinophils Absolute 10/12/2022 0.1  0.0 - 0.5 K/uL Final   Basophils Relative 10/12/2022 1  % Final   Basophils Absolute  10/12/2022 0.0  0.0 - 0.1 K/uL Final   Immature Granulocytes 10/12/2022 0  % Final   Abs Immature Granulocytes 10/12/2022 0.00  0.00 - 0.07 K/uL Final   Performed at [facility], [address], [place], [place] [address]   Sodium 10/12/2022 140  135 - 145 mmol/L Final   Potassium 10/12/2022 3.7  3.5 - 5.1 mmol/L Final   Chloride 10/12/2022 100  98 - 111 mmol/L Final   CO2 10/12/2022 26  22 - 32 mmol/L Final   Glucose, Bld 10/12/2022 90  70 - 99 mg/dL Final   Glucose reference range applies only to samples taken after fasting for at least 8 hours.   BUN 10/12/2022 15  6 - 20 mg/dL Final   Creatinine, Ser 10/12/2022 0.95  0.61 - 1.24 mg/dL Final   Calcium 94/17/4081 10.1  8.9 - 10.3 mg/dL Final   Total Protein 44/81/8563 8.1  6.5 - 8.1 g/dL Final   Albumin 14/97/0263 4.6  3.5 - 5.0 g/dL Final   AST 78/58/8502 39  15 - 41 U/L Final   ALT 10/12/2022 23  0 - 44 U/L Final   Alkaline Phosphatase 10/12/2022 81  38 - 126 U/L Final   Total Bilirubin 10/12/2022 1.8 (H)  0.3 - 1.2 mg/dL Final   GFR, Estimated 10/12/2022 >60  >60 mL/min Final   Comment: (NOTE) Calculated using the CKD-EPI Creatinine Equation (2021)    Anion gap 10/12/2022 14  5 - 15 Final   Performed at [facility], [address], [place], [place] [address]   Alcohol, Ethyl (B) 10/12/2022 <10  <10 mg/dL Final   Comment: (NOTE) Lowest detectable limit for serum alcohol is 10 mg/dL.  For medical purposes only. Performed at [facility], [address] [place], [place] [address]    SARSCOV2ONAVIRUS 2 AG 10/12/2022 NEGATIVE  NEGATIVE Final   Comment: (NOTE) SARS-CoV-2 antigen NOT DETECTED.   Negative results are presumptive.  Negative results do not preclude SARS-CoV-2 infection and should not be used as the sole basis for treatment or other patient management decisions, including infection  control decisions, particularly in the presence of clinical signs and  symptoms consistent with COVID-19, or in  those who have been in contact with the virus.  Negative results must be combined with clinical observations, patient history, and epidemiological information. The expected result is Negative.  Fact Sheet for Patients: https://www.jennings-kim.com/  Fact Sheet for Healthcare Providers: https://alexander-rogers.biz/  This test is not yet approved or cleared by the [place] FDA and  has been authorized for detection and/or diagnosis of SARS-CoV-2 by FDA under an Emergency Use Authorization (EUA).  This EUA will remain in effect (meaning this test can be used) for the duration of  the COV                          ID-19 declaration under Section 564(b)(1) of the Act, 21 U.S.C. section 360bbb-3(b)(1), unless the authorization is terminated or revoked sooner.     Cholesterol 10/12/2022 162  0 - 200 mg/dL Final   Triglycerides 16/04/9603 66  <150 mg/dL Final   HDL 54/03/8118 70  >40 mg/dL Final   Total CHOL/HDL Ratio 10/12/2022 2.3  RATIO Final   VLDL 10/12/2022 13  0 - 40 mg/dL Final   LDL Cholesterol 10/12/2022 79  0 - 99 mg/dL Final   Comment:        Total Cholesterol/HDL:CHD Risk Coronary Heart Disease Risk Table                     Men   Women  1/2 Average Risk   3.4   3.3  Average Risk       5.0   4.4  2 X Average Risk   9.6   7.1  3 X Average Risk  23.4   11.0        Use the calculated Patient Ratio above and the CHD Risk Table to determine the patient's CHD Risk.        ATP III CLASSIFICATION (LDL):  <100     mg/dL   Optimal  147-829  mg/dL   Near or Above                    Optimal  130-159  mg/dL   Borderline  562-130  mg/dL   High  >865     mg/dL   Very High Performed at [facility], [address] [place], [place] [address]    SARS Coronavirus 2 by RT PCR 10/12/2022 NEGATIVE  NEGATIVE Final   Performed at [facility], [address], [place], [place] [address]  Office Visit on 04/23/2022  Component Date Value Ref  Range Status   Hemoglobin A1C 04/23/2022 5.9 (A)  4.0 - 5.6 % Final   POC Glucose 04/23/2022 115 (A)  70 - 99 mg/dl Final    Allergies: Patient has no known allergies.  Medications:  Facility Ordered Medications  Medication   acetaminophen (TYLENOL) tablet 650 mg   alum & mag hydroxide-simeth (MAALOX/MYLANTA) 200-200-20 MG/5ML suspension 30 mL   magnesium hydroxide (MILK OF MAGNESIA) suspension 30 mL   dicyclomine (BENTYL) tablet 20 mg   hydrOXYzine (ATARAX) tablet 25 mg   loperamide (IMODIUM) capsule 2-4 mg   methocarbamol (ROBAXIN) tablet 500 mg   naproxen (NAPROSYN) tablet 500 mg   ondansetron (ZOFRAN-ODT) disintegrating tablet 4 mg   traZODone (DESYREL) tablet 50 mg   PTA Medications  Medication Sig   tadalafil (CIALIS) 10 MG tablet Take 1 tablet (10 mg total) by mouth daily. (Patient not taking: Reported on 09/20/2022)    Long Term Goals:  Improvement in symptoms so as ready for discharge  Short Term Goals: Patient will verbalize feelings in meetings with treatment team members., Patient will attend at least of 50% of the groups daily., Pt will complete the PHQ9 on admission, day 3 and discharge., and Patient will take medications as prescribed daily.  Medical Decision Making  Welcome Fults is a 57 year old male with a psychiatric history of alcohol abuse and cocaine abuse presenting voluntarily to [facility].  Patient reports  relapsing on crack cocaine and alcohol after being sober for 1 year and male is experiencing worsening depression symptoms.  Patient denies any SI HI or AVH.     Recommendations  Based on my evaluation the patient does not appear to have an emergency medical condition. Patient will be admitted to [facility] for crisis management,safety and stabilization of substance use.  Jasper Riling, NP 10/13/22  5:54 AM
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
## 2022-10-12 NOTE — Progress Notes (Signed)
   10/12/22 2057  BHUC Triage Screening (Walk-ins at [facility] only)  How Did You Hear About [place]? Family/Friend  What Is the Reason for Your Visit/Call Today? Pt presents to [facility] voluntarily, accompanied by his family with complaint of stress, worsening depression and substance abuse treatment. Pt is experiencing homelessness at this time and is having difficulty managing his emotions. Pt reports that he relapsed on crack and alcohol yesterday after being sober for almost a year. Pt has no history of psychiatric care or impatient hospitalizations. Pt currently denies SI, HI, AVH.  How Long Has This Been Causing You Problems? <Week  Have You Recently Had Any Thoughts About Hurting Yourself? No  Are You Planning to Commit Suicide/Harm Yourself At This time? No  Have you Recently Had Thoughts About Hurting Someone Karolee Ohs? No  Are You Planning To Harm Someone At This Time? No  Are you currently experiencing any auditory, visual or other hallucinations? No  Have You Used Any Alcohol or Drugs in the Past 24 Hours? Yes  How long ago did you use Drugs or Alcohol? yesterday  What Did You Use and How Much? unknown amount  Do you have any current medical co-morbidities that require immediate attention? No  Clinician description of patient physical appearance/behavior: Pt is calm, cooperative  What Do You Feel Would Help You the Most Today? Treatment for Depression or other mood problem;Alcohol or Drug Use Treatment  If access to [facility] was not available, would you have sought care in the Emergency Department? No  Determination of Need Urgent (48 hours)  Options For Referral Other: Comment;Outpatient Therapy;Facility-Based Crisis

## 2022-10-13 ENCOUNTER — Encounter (HOSPITAL_COMMUNITY): Payer: Self-pay | Admitting: Nurse Practitioner

## 2022-10-13 DIAGNOSIS — F1914 Other psychoactive substance abuse with psychoactive substance-induced mood disorder: Secondary | ICD-10-CM | POA: Diagnosis not present

## 2022-10-13 DIAGNOSIS — F1010 Alcohol abuse, uncomplicated: Secondary | ICD-10-CM | POA: Diagnosis not present

## 2022-10-13 DIAGNOSIS — Z6372 Alcoholism and drug addiction in family: Secondary | ICD-10-CM | POA: Diagnosis not present

## 2022-10-13 DIAGNOSIS — F1410 Cocaine abuse, uncomplicated: Secondary | ICD-10-CM | POA: Diagnosis not present

## 2022-10-13 LAB — CBC WITH DIFFERENTIAL/PLATELET
Abs Immature Granulocytes: 0 10*3/uL (ref 0.00–0.07)
Basophils Absolute: 0 10*3/uL (ref 0.0–0.1)
Basophils Relative: 1 %
Eosinophils Absolute: 0.1 10*3/uL (ref 0.0–0.5)
Eosinophils Relative: 1 %
HCT: 46.4 % (ref 39.0–52.0)
Hemoglobin: 16.4 g/dL (ref 13.0–17.0)
Immature Granulocytes: 0 %
Lymphocytes Relative: 38 %
Lymphs Abs: 2.4 10*3/uL (ref 0.7–4.0)
MCH: 31.2 pg (ref 26.0–34.0)
MCHC: 35.3 g/dL (ref 30.0–36.0)
MCV: 88.2 fL (ref 80.0–100.0)
Monocytes Absolute: 0.5 10*3/uL (ref 0.1–1.0)
Monocytes Relative: 8 %
Neutro Abs: 3.3 10*3/uL (ref 1.7–7.7)
Neutrophils Relative %: 52 %
Platelets: 209 10*3/uL (ref 150–400)
RBC: 5.26 MIL/uL (ref 4.22–5.81)
RDW: 12.2 % (ref 11.5–15.5)
WBC: 6.3 10*3/uL (ref 4.0–10.5)
nRBC: 0 % (ref 0.0–0.2)

## 2022-10-13 LAB — COMPREHENSIVE METABOLIC PANEL
ALT: 23 U/L (ref 0–44)
AST: 39 U/L (ref 15–41)
Albumin: 4.6 g/dL (ref 3.5–5.0)
Alkaline Phosphatase: 81 U/L (ref 38–126)
Anion gap: 14 (ref 5–15)
BUN: 15 mg/dL (ref 6–20)
CO2: 26 mmol/L (ref 22–32)
Calcium: 10.1 mg/dL (ref 8.9–10.3)
Chloride: 100 mmol/L (ref 98–111)
Creatinine, Ser: 0.95 mg/dL (ref 0.61–1.24)
GFR, Estimated: >60 mL/min (ref >60)
Glucose, Bld: 90 mg/dL (ref 70–99)
Potassium: 3.7 mmol/L (ref 3.5–5.1)
Sodium: 140 mmol/L (ref 135–145)
Total Bilirubin: 1.8 mg/dL — ABNORMAL HIGH (ref 0.3–1.2)
Total Protein: 8.1 g/dL (ref 6.5–8.1)

## 2022-10-13 LAB — ETHANOL: Alcohol, Ethyl (B): <10 mg/dL (ref <10)

## 2022-10-13 LAB — POCT URINE DRUG SCREEN - MANUAL ENTRY (I-SCREEN)
POC Amphetamine UR: NOT DETECTED
POC Buprenorphine (BUP): NOT DETECTED
POC Cocaine UR: POSITIVE — AB
POC Marijuana UR: NOT DETECTED
POC Methadone UR: NOT DETECTED
POC Methamphetamine UR: NOT DETECTED
POC Morphine: NOT DETECTED
POC Oxazepam (BZO): NOT DETECTED
POC Oxycodone UR: NOT DETECTED
POC Secobarbital (BAR): NOT DETECTED

## 2022-10-13 LAB — LIPID PANEL
Cholesterol: 162 mg/dL (ref 0–200)
HDL: 70 mg/dL (ref >40)
LDL Cholesterol: 79 mg/dL (ref 0–99)
Total CHOL/HDL Ratio: 2.3 RATIO
Triglycerides: 66 mg/dL (ref <150)
VLDL: 13 mg/dL (ref 0–40)

## 2022-10-13 LAB — SARS CORONAVIRUS 2 BY RT PCR: SARS Coronavirus 2 by RT PCR: NEGATIVE

## 2022-10-13 MED ORDER — THIAMINE HCL 100 MG/ML IJ SOLN
100.0000 mg | Freq: Once | INTRAMUSCULAR | Status: AC
  Administered 2022-10-13: 100 mg via INTRAMUSCULAR
  Filled 2022-10-13: qty 2

## 2022-10-13 MED ORDER — ADULT MULTIVITAMIN W/MINERALS CH
1.0000 | ORAL_TABLET | Freq: Every day | ORAL | Status: DC
  Administered 2022-10-13 – 2022-10-19 (×7): 1 via ORAL
  Filled 2022-10-13 (×7): qty 1

## 2022-10-13 MED ORDER — THIAMINE MONONITRATE 100 MG PO TABS
100.0000 mg | ORAL_TABLET | Freq: Every day | ORAL | Status: DC
  Administered 2022-10-14 – 2022-10-19 (×6): 100 mg via ORAL
  Filled 2022-10-13 (×6): qty 1

## 2022-10-13 MED ORDER — LORAZEPAM 1 MG PO TABS: 1.0000 mg | ORAL_TABLET | Freq: Four times a day (QID) | ORAL | Status: AC | PRN

## 2022-10-13 NOTE — ED Notes (Signed)
Patient observed/assessed in room in bed appearing in no immediate distress resting peacefully. Q15 minute checks continued by [facility] and nursing staff. Will continue to monitor and support. 

## 2022-10-13 NOTE — ED Notes (Addendum)
Patient is watching tv in the dayroom. Environment secured. Alert and oriented. Will continue to monitor for safety. 

## 2022-10-13 NOTE — ED Notes (Signed)
Patient alert and oriented x 3. Denies SI/HI/AVH. Denies intent or plan to harm self or others. Routine conducted according to faculty protocol. Encourage patient to notify staff with any needs or concerns. Patient verbalized agreement and understanding. Will continue to monitor for safety. 

## 2022-10-13 NOTE — ED Notes (Signed)
Patient observed/assessed in dayroom watching TV and conversing with other patients. Patient alert and oriented x 4. Affect is WNL. Patient denies pain and anxiety. He denies A/V/H. He denies having any thoughts/plan of self harm and harm towards others. Fluid and snack offered. Patient states that appetite has been good throughout the day. Verbalizes no further complaints at this time. Will continue to monitor and support.

## 2022-10-13 NOTE — ED Notes (Addendum)
Pt is a 57 year old divorced male who presents to [facility] with history of substance use, primarily alcohol and cocaine. He states yesterday was his 1 year anniversary of sobriety and he met and attractive male and relapsed on alcohol and cocaine. He says he does not know exactly how much he uses but he spent $500 on beer, liquor, and crack. He says that having money is a trigger for him. He describes feeling very depressed and says he cannot control his emotions. He acknowledges symptoms including tearfulness, fatigue, irritability, and feelings of guilt and worthlessness. He denies current suicidal ideation or history of suicide attempts. Pt denies any history of intentional self-injurious behaviors. Pt denies current homicidal ideation or history of violence. Pt denies any history of auditory or visual hallucinations. Patient endorses suicidal ideation with a plan to die "suicide by cop".  Patient reports "I've has been feeling this way off and on for the past month or so".  Patient denies HI, denies AVH.  Pt is calm and cooperative during skin assessment. Pt is in bed appears asleep, rise and fall of chest noted. Will continue with close observations as per facility protocol and update as needed.

## 2022-10-13 NOTE — ED Notes (Signed)
Snacks given 

## 2022-10-13 NOTE — ED Notes (Signed)
Patient in room. Environment is secured. Will continue to monitor for safety. 
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
## 2022-10-13 NOTE — Group Note (Signed)
Group Topic: Understanding Self  Group Date: 10/13/2022 Start Time: 1630 End Time: 1704 Facilitators: Vonzell Schlatter B  Department: [facility]  Number of Participants: 8  Group Focus: self-awareness Treatment Modality:  Psychoeducation Interventions utilized were problem solving Purpose: express feelings  Name: Philip Collins Date of Birth: March 20, 1966  MR: [id number]    Level of Participation: active Quality of Participation: attentive and cooperative Interactions with others: gave feedback Mood/Affect: positive Triggers (if applicable): na Cognition: coherent/clear Progress: Moderate Response: na Plan: follow-up needed  Patients Problems:  Patient Active Problem List   Diagnosis Date Noted   Cocaine use disorder 10/12/2022   Hemorrhoids 09/20/2022   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

## 2022-10-14 DIAGNOSIS — F1410 Cocaine abuse, uncomplicated: Secondary | ICD-10-CM | POA: Diagnosis not present

## 2022-10-14 DIAGNOSIS — F1914 Other psychoactive substance abuse with psychoactive substance-induced mood disorder: Secondary | ICD-10-CM | POA: Diagnosis not present

## 2022-10-14 DIAGNOSIS — F1010 Alcohol abuse, uncomplicated: Secondary | ICD-10-CM | POA: Diagnosis not present

## 2022-10-14 DIAGNOSIS — Z6372 Alcoholism and drug addiction in family: Secondary | ICD-10-CM | POA: Diagnosis not present

## 2022-10-14 NOTE — ED Notes (Signed)
Patient observed/assessed in room in bed appearing in no immediate distress resting peacefully. Q15 minute checks continued by [facility] and nursing staff. Will continue to monitor and support. 

## 2022-10-14 NOTE — ED Notes (Signed)
Pt is currently sleeping, no distress noted, environmental check complete, will continue to monitor patient for safety.  

## 2022-10-14 NOTE — ED Provider Notes (Signed)
Behavioral Health Progress Note  Date and Time: 10/15/2022 7:22 AM Name: Rosary Livelyrent Collymore MRN:  [id number]  Subjective:   Rosary Livelyrent Deacon is a 57 year old male with a psychiatric history of alcohol abuse and cocaine abuse presented voluntarily to [facility].  Patient relapsed on crack cocaine and alcohol after being sober for 1 year and is experiencing worsening depression and anxiety symptoms. Patient denies any SI HI or AVH.   On Assessment Today: Pt states his mood remains down as he is unable to trust himself. Motivational interviewing is provided to encourage patient to realize that he has not failed and still has a path forward.  He slept well last night. Pt reports fair appetite. Today, he reports desire for residential substance use treatment. He continues to endorse depressive symptoms and appears depressed. Again discussed starting SSRI for depression and naltrexone or gabapentin for cravings, and he declines, reporting that he is "medication averse."  Currently, pt denies any active suicidal ideation, homicidal ideation and, visual and auditory hallucination.  Pt denies any further concerns.    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 [place] Past Medical History: Denies any significant medical issues Family History: Father-alcoholism, Brother-alcoholism Social History: 89 y/o divorced, relapsed after being 1-year sober, works at the [employer] company  Diagnosis:  Final diagnoses:  Alcohol use disorder  Cocaine use disorder  Substance induced mood disorder    Total Time spent with patient: 30 minutes   Additional Social History:    Pain Medications: Denies abuse Prescriptions: Denies abuse Over the Counter: Denies abuse History of alcohol / drug use?: Yes Longest period of sobriety (when/how long): 1 year, relapsed yesterday Negative Consequences of Use: Financial, Personal relationships Withdrawal Symptoms: None Name of Substance 1: Alcohol 1 - Age of First Use:  Adolescent 1 - Amount (size/oz): Varied 1 - Frequency: Was drinking daily 1 - Duration: Ongoing 1 - Last Use / Amount: 10/11/2022, unknown amount 1 - Method of Aquiring: Purchase 1- Route of Use: Oral ingestion Name of Substance 2: Cocaine 2 - Age of First Use: Adolescent 2 - Amount (size/oz): Varies 2 - Frequency: Daily when available 2 - Duration: Ongoing 2 - Last Use / Amount: 10/11/2022, unknown amount 2 - Method of Aquiring: Unknown 2 - Route of Substance Use: Smoke inhalation                Sleep: Good  Appetite:  Fair  Current Medications:  Current Facility-Administered Medications  Medication Dose Route Frequency Provider Last Rate Last Admin   acetaminophen (TYLENOL) tablet 650 mg  650 mg Oral Q6H PRN Bobbitt, Shalon E, NP       alum & mag hydroxide-simeth (MAALOX/MYLANTA) 200-200-20 MG/5ML suspension 30 mL  30 mL Oral Q4H PRN Bobbitt, Shalon E, NP       hydrOXYzine (ATARAX) tablet 25 mg  25 mg Oral Q6H PRN Bobbitt, Shalon E, NP       loperamide (IMODIUM) capsule 2-4 mg  2-4 mg Oral PRN Bobbitt, Shalon E, NP       LORazepam (ATIVAN) tablet 1 mg  1 mg Oral Q6H PRN Doda, Vandana, MD       magnesium hydroxide (MILK OF MAGNESIA) suspension 30 mL  30 mL Oral Daily PRN Bobbitt, Shalon E, NP       multivitamin with minerals tablet 1 tablet  1 tablet Oral Daily Karsten Rooda, Vandana, MD   1 tablet at 10/14/22 0907   ondansetron (ZOFRAN-ODT) disintegrating tablet 4 mg  4 mg Oral Q6H PRN Bobbitt, Shalon  E, NP       thiamine (VITAMIN B1) tablet 100 mg  100 mg Oral Daily Karsten Ro, MD   100 mg at 10/14/22 0907   traZODone (DESYREL) tablet 50 mg  50 mg Oral QHS PRN Bobbitt, Shalon E, NP       No current outpatient medications on file.    Labs  Lab Results:  Admission on 10/12/2022  Component Date Value Ref Range Status   WBC 10/12/2022 6.3  4.0 - 10.5 K/uL Final   RBC 10/12/2022 5.26  4.22 - 5.81 MIL/uL Final   Hemoglobin 10/12/2022 16.4  13.0 - 17.0 g/dL Final   HCT  16/04/9603 46.4  39.0 - 52.0 % Final   MCV 10/12/2022 88.2  80.0 - 100.0 fL Final   MCH 10/12/2022 31.2  26.0 - 34.0 pg Final   MCHC 10/12/2022 35.3  30.0 - 36.0 g/dL Final   RDW 54/03/8118 12.2  11.5 - 15.5 % Final   Platelets 10/12/2022 209  150 - 400 K/uL Final   nRBC 10/12/2022 0.0  0.0 - 0.2 % Final   Neutrophils Relative % 10/12/2022 52  % Final   Neutro Abs 10/12/2022 3.3  1.7 - 7.7 K/uL Final   Lymphocytes Relative 10/12/2022 38  % Final   Lymphs Abs 10/12/2022 2.4  0.7 - 4.0 K/uL Final   Monocytes Relative 10/12/2022 8  % Final   Monocytes Absolute 10/12/2022 0.5  0.1 - 1.0 K/uL Final   Eosinophils Relative 10/12/2022 1  % Final   Eosinophils Absolute 10/12/2022 0.1  0.0 - 0.5 K/uL Final   Basophils Relative 10/12/2022 1  % Final   Basophils Absolute 10/12/2022 0.0  0.0 - 0.1 K/uL Final   Immature Granulocytes 10/12/2022 0  % Final   Abs Immature Granulocytes 10/12/2022 0.00  0.00 - 0.07 K/uL Final   Performed at [facility], [address] [place], [place] [address]   Sodium 10/12/2022 140  135 - 145 mmol/L Final   Potassium 10/12/2022 3.7  3.5 - 5.1 mmol/L Final   Chloride 10/12/2022 100  98 - 111 mmol/L Final   CO2 10/12/2022 26  22 - 32 mmol/L Final   Glucose, Bld 10/12/2022 90  70 - 99 mg/dL Final   Glucose reference range applies only to samples taken after fasting for at least 8 hours.   BUN 10/12/2022 15  6 - 20 mg/dL Final   Creatinine, Ser 10/12/2022 0.95  0.61 - 1.24 mg/dL Final   Calcium 95/62/1308 10.1  8.9 - 10.3 mg/dL Final   Total Protein 65/78/4696 8.1  6.5 - 8.1 g/dL Final   Albumin 29/52/8413 4.6  3.5 - 5.0 g/dL Final   AST 24/40/1027 39  15 - 41 U/L Final   ALT 10/12/2022 23  0 - 44 U/L Final   Alkaline Phosphatase 10/12/2022 81  38 - 126 U/L Final   Total Bilirubin 10/12/2022 1.8 (H)  0.3 - 1.2 mg/dL Final   GFR, Estimated 10/12/2022 >60  >60 mL/min Final   Comment: (NOTE) Calculated using the CKD-EPI Creatinine Equation (2021)    Anion gap  10/12/2022 14  5 - 15 Final   Performed at [facility], [address] [place], [place] [address]   Alcohol, Ethyl (B) 10/12/2022 <10  <10 mg/dL Final   Comment: (NOTE) Lowest detectable limit for serum alcohol is 10 mg/dL.  For medical purposes only. Performed at [facility], [address], [place], [place] [address]  POC Amphetamine UR 10/13/2022 None Detected  NONE DETECTED (Cut Off Level 1000 ng/mL) Final   POC Secobarbital (BAR) 10/13/2022 None Detected  NONE DETECTED (Cut Off Level 300 ng/mL) Final   POC Buprenorphine (BUP) 10/13/2022 None Detected  NONE DETECTED (Cut Off Level 10 ng/mL) Final   POC Oxazepam (BZO) 10/13/2022 None Detected  NONE DETECTED (Cut Off Level 300 ng/mL) Final   POC Cocaine UR 10/13/2022 Positive (A)  NONE DETECTED (Cut Off Level 300 ng/mL) Final   POC Methamphetamine UR 10/13/2022 None Detected  NONE DETECTED (Cut Off Level 1000 ng/mL) Final   POC Morphine 10/13/2022 None Detected  NONE DETECTED (Cut Off Level 300 ng/mL) Final   POC Methadone UR 10/13/2022 None Detected  NONE DETECTED (Cut Off Level 300 ng/mL) Final   POC Oxycodone UR 10/13/2022 None Detected  NONE DETECTED (Cut Off Level 100 ng/mL) Final   POC Marijuana UR 10/13/2022 None Detected  NONE DETECTED (Cut Off Level 50 ng/mL) Final   SARSCOV2ONAVIRUS 2 AG 10/12/2022 NEGATIVE  NEGATIVE Final   Comment: (NOTE) SARS-CoV-2 antigen NOT DETECTED.   Negative results are presumptive.  Negative results do not preclude SARS-CoV-2 infection and should not be used as the sole basis for treatment or other patient management decisions, including infection  control decisions, particularly in the presence of clinical signs and  symptoms consistent with COVID-19, or in those who have been in contact with the virus.  Negative results must be combined with clinical observations, patient history, and epidemiological information. The expected result is Negative.  Fact Sheet for Patients:  https://www.jennings-kim.com/  Fact Sheet for Healthcare Providers: https://alexander-rogers.biz/  This test is not yet approved or cleared by the [place] FDA and  has been authorized for detection and/or diagnosis of SARS-CoV-2 by FDA under an Emergency Use Authorization (EUA).  This EUA will remain in effect (meaning this test can be used) for the duration of  the COV                          ID-19 declaration under Section 564(b)(1) of the Act, 21 U.S.C. section 360bbb-3(b)(1), unless the authorization is terminated or revoked sooner.     Cholesterol 10/12/2022 162  0 - 200 mg/dL Final   Triglycerides 16/04/9603 66  <150 mg/dL Final   HDL 54/03/8118 70  >40 mg/dL Final   Total CHOL/HDL Ratio 10/12/2022 2.3  RATIO Final   VLDL 10/12/2022 13  0 - 40 mg/dL Final   LDL Cholesterol 10/12/2022 79  0 - 99 mg/dL Final   Comment:        Total Cholesterol/HDL:CHD Risk Coronary Heart Disease Risk Table                     Men   Women  1/2 Average Risk   3.4   3.3  Average Risk       5.0   4.4  2 X Average Risk   9.6   7.1  3 X Average Risk  23.4   11.0        Use the calculated Patient Ratio above and the CHD Risk Table to determine the patient's CHD Risk.        ATP III CLASSIFICATION (LDL):  <100     mg/dL   Optimal  147-829  mg/dL   Near or Above                    Optimal  130-159  mg/dL   Borderline  116-579  mg/dL   High  >038     mg/dL   Very High Performed at [facility], [address], [place], [place] [address]    SARS Coronavirus 2 by RT PCR 10/12/2022 NEGATIVE  NEGATIVE Final   Performed at [facility], [address] [place], [place] [address]  Office Visit on 04/23/2022  Component Date Value Ref Range Status   Hemoglobin A1C 04/23/2022 5.9 (A)  4.0 - 5.6 % Final   POC Glucose 04/23/2022 115 (A)  70 - 99 mg/dl Final    Blood Alcohol level:  Lab Results  Component Value Date   ETH <10 10/12/2022    Metabolic  Disorder Labs: Lab Results  Component Value Date   HGBA1C 5.9 (A) 04/23/2022   No results found for: "PROLACTIN" Lab Results  Component Value Date   CHOL 162 10/12/2022   TRIG 66 10/12/2022   HDL 70 10/12/2022   CHOLHDL 2.3 10/12/2022   VLDL 13 10/12/2022   LDLCALC 79 10/12/2022   LDLCALC 81 01/15/2022    Therapeutic Lab Levels: No results found for: "LITHIUM" No results found for: "VALPROATE" No results found for: "CBMZ"  Physical Findings   PHQ2-9    Flowsheet Row ED from 10/12/2022 in [facility] Office Visit from 09/20/2022 in [facility] Office Visit from 01/15/2022 in [facility]  PHQ-2 Total Score 2 0 0  PHQ-9 Total Score 5 0 --      Flowsheet Row ED from 10/12/2022 in [facility] (Discharged) from 03/20/2022 in [facility] ED from 10/04/2021 in [facility] Emergency Department at [facility]  C-SSRS RISK CATEGORY No Risk No Risk No Risk        Musculoskeletal  Strength & Muscle Tone: within normal limits Gait & Station: normal Patient leans: N/A  Psychiatric Specialty Exam  Presentation  General Appearance:  Appropriate for Environment  Eye Contact: Good  Speech: Clear and Coherent  Speech Volume: Normal  Handedness: Right   Mood and Affect  Mood: Depressed  Affect: Congruent   Thought Process  Thought Processes: Coherent; Goal Directed  Descriptions of Associations:Intact  Orientation:Full (Time, Place and Person)  Thought Content:Logical; WDL  Diagnosis of Schizophrenia or Schizoaffective disorder in past: No    Hallucinations:Hallucinations: None   Ideas of Reference:None  Suicidal Thoughts:Suicidal Thoughts: No   Homicidal Thoughts:Homicidal Thoughts: No    Sensorium  Memory: Immediate Good; Recent Fair  Judgment: Fair  Insight: Good   Executive Functions   Concentration: Good  Attention Span: Good  Recall: Good  Fund of Knowledge: Good  Language: Good   Psychomotor Activity  Psychomotor Activity: Psychomotor Activity: Normal    Assets  Assets: Communication Skills; Desire for Improvement; Financial Resources/Insurance; Physical Health; Resilience; Social Support   Sleep  Sleep: Sleep: Good    No data recorded   Physical Exam  Physical Exam Vitals reviewed.  Constitutional:      Appearance: Normal appearance.  Pulmonary:     Effort: Pulmonary effort is normal.  Neurological:     General: No focal deficit present.     Mental Status: He is alert and oriented to person, place, and time.    Review of Systems  Constitutional:  Negative for fever.  Respiratory:  Negative for cough.   Cardiovascular:  Negative for chest pain.  Musculoskeletal:  Negative for myalgias.  Neurological:  Negative for dizziness and headaches.  Psychiatric/Behavioral:  Negative for depression.    Blood pressure (!) 94/59, pulse 62, temperature 98.1 F (36.7 C), temperature source Oral, resp. rate 16, SpO2 100 %. There is no height or weight on file to calculate BMI.  Treatment Plan Summary: Lewey Cheeves is a 57 year old male with a psychiatric history of alcohol abuse and cocaine abuse presenting voluntarily to [facility].  Patient reports relapsing on crack cocaine and alcohol after being sober for 1 year and male is experiencing worsening depression and anxiety symptoms. Patient denies any SI HI or AVH. Abnormal labs-UDS-cocaine positive, total bilirubin 1.8, HbA1c 5.9, glucose 115 Patient was started on COWS and clonidine taper by night provider Daily contact with patient to assess and evaluate symptoms and progress in treatment Alcohol use -Continue CIWA with PRN Ativan for CIWA more than 10 -Continue vitamin B1, multivitamin -Continue Zofran, trazodone, MOM, Imodium, hydroxyzine, Maalox, Tylenol -Offered naltrexone but patient  declined  Cocaine use -Patient interested in inpatient rehab; SW to assist with placement.  Substance induced mood disorder Offered SSRI but patient declined  Dispo-Pending  Lamar Sprinkles, MD 10/15/2022 7:22 AM

## 2022-10-14 NOTE — Discharge Instructions (Signed)
[facility] [address] [place], [place], [address] [phone] phone  New Patient Assessment/Therapy Walk-Ins:  Monday and Wednesday: 8 am until slots are full. Every 1st and 2nd Fridays of the month: 1 pm - 5 pm.  NO ASSESSMENT/THERAPY WALK-INS ON TUESDAYS OR THURSDAYS  New Patient Assessment/Medication Management Walk-Ins:  Monday - Friday:  8 am - 11 am.  For all walk-ins, we ask that you arrive by 7:30 am because patients will be seen in the order of arrival.  Availability is limited; therefore, you may not be seen on the same day that you walk-in.  Our goal is to serve and meet the needs of our community to the best of our ability.  SUBSTANCE USE TREATMENT for Medicaid and State Funded/IPRS  [facility] [address], [place], [address] [phone] phone NOTE: [facility] is no longer offering IOP services.  Serves those who are low-income or have no insurance.  [facility] [address], [place], [place], [address] [phone] phone [phone] fax NOTE: Does have Substance Abuse-Intensive Outpatient Program (SAIOP) as well as transitional housing if eligible.  [facility] [address] [place], [place], [address] [phone] phone [phone] fax  [facility] [address] [place], [place], [address] [phone] phone [phone] fax  HALFWAY HOUSES:  [facility] [phone]  [facility] www.oxfordvacancies.com  12 STEP PROGRAMS:  Alcoholics Anonymous of [place] https://aagreensboronc.com/meeting  Narcotics Anonymous of [place] https://greensborona.org/meetings/  Al-Anon of [place], [place] www.g[place]alanon.org/find-meetings.html  Nar-Anon https://nar-anon.org/find-a-meetin  List of Residential placements:   [facility] in [place]: [phone]  [facility]: [phone]  [facility]: [place], [place]  [phone]: Male and male facility; 30-day program: (uninsured and Medicaid such as [facility], [facility], [facility], partners)  [facility]: [phone]; men and women's facility; 28 days; Can have Medicaid tailored plan ([facility] or [facility])  [facility]: [phone] [place] or [place]; 28 day program; must be fully detox; tailored Medicaid or no insurance  [facility] in [place], [place]; [phone]; 28 day all males program; no insurance accepted  [facility] in [place]: Joe [phone] (no insurance or Medicaid only); 90 days; outpatient services but provide housing in apartments [place]  [facility] Admission: [phone]: Patient must complete phone screening for placement: [place], [place]; 6 month program; uninsured, Medicaid, and [facility] insurance.   [facility]: no insurance required; [phone]  [facility]: [phone]; Intake: Robert; Must fill out application online; Victor Delay [phone] x [phone]  [facility] in [place], [place]: [phone]; Admissions Coordinators Mr. Dennis or David Gibson; 90 day program.  [facility]: [place], [place] [phone]; Co-Ed 9 month to a year program; Online application; Men entry fee is $500 (6-12months);  [facility]: [address] [place], [place] [address]; no fee or insurance required; minimum of 2 years; Highly structured; work based; Intake Coordinator is Chris [phone]  [facility] in [place], [place]: [phone]; Fax number is [phone]; website: www.[facility].org; Requires 3-6 page autobiography; 2 year program (18 months and then 6month transitional housing); Admission fee is $300; no insurance needed; work program  [facility] in [place], [place]: Front Desk Staff: Reeci [phone]: They have a Men's Regenerations Program 6-9months. Free program; There is an initial $300 fee however, they are willing to work  with patients regarding that. Application is online.  [facility]: Admissions [phone] Benjamin Cox ext [phone]; Any 7-90 day program is out of pocket; 12   month program is free of charge; there is a $275 entry fee; Patient is responsible for own transportation 

## 2022-10-14 NOTE — ED Notes (Signed)
Patient observed/assessed Flat. Patient alert and oriented x 4. Affect is flat. Patient denies pain and anxiety. He denies A/V/H. He denies having any thoughts/plan of self harm and harm towards others. Fluid and snack offered. Patient states that appetite has been good throughout the day.  Verbalizes no further complaints at this time. Will continue to monitor and support.

## 2022-10-14 NOTE — ED Notes (Addendum)
Patient A&Ox4. Denies intent to harm self/others when asked. Denies A/VH. Patient denies any physical complaints when asked. Pt states, "I'm not having any withdrawal sx but I am trying to get over this depression. It's in my head and that makes me just want to sleep all day. I feel about the same as I did yesterday, maybe a little better today but not much". Support and encouragement provided. Writer recommended pt to speak with provider about starting an antidepressant but pt states, "I really want to work through this without medication if I can". Support provided.  Routine safety checks conducted according to facility protocol. Encouraged patient to notify staff if thoughts of harm toward self or others arise. Patient verbalize understanding and agreement. Will continue to monitor for safety.

## 2022-10-14 NOTE — ED Notes (Signed)
Pt sleeping in no acute distress. RR even and unlabored. Environment secured. Will continue to monitor for safety. 

## 2022-10-14 NOTE — Tx Team (Signed)
LCSW met with patient to assess current mood, affect, physical state, and inquire about needs/goals while here in [facility] and after discharge. Patient reports he presented due to dealing with depression from a recent relapse. Patient reports he used about 3-5 grams of cocaine and drank a couple of beers over a 24 hour period. Patient reports being 12 months clean of alcohol and crack cocaine, however reports last Friday 04/05, he decided to use. Patient became tearful as he spoke about his recent lapse and stated, "I hate that I have to start all the way over". LCSW praised patient for the 12 month period of sobriety and provided brief supportive counseling to the patient. Patient was receptive to the feedback provided. Patient reports prior to coming into the [facility] he was staying [facility] since January, however kicked out for returning back to use. Patient reports prior to that he was at the [facility] for 6 months, however reports he left due to the way he was being treated. Patient was assigned as the Admission Coordinator for the [facility]. Patient reports he has tried residential placement multiple times in the past. Patient reports goal and driving force has been to get himself clean and return back to [place] where his children are. Patient reports he has a 16 year old son in [place], and a 76, 52, and 1 year old in [place] with his ex-wife. Patient reports he has been separated for 13 years and moved to [place] five years ago to be with family. Patient reports he has a sister here in [place] that he has a good relationship with. Patient denies having any form of income at this time and denies receiving assistance from family.  Patient reports his current goal is to seek residential placement for substance use. Patient aware that LCSW will send referrals out for review and will follow up to provide updates as received. Patient expressed understanding and appreciation of LCSW assistance. No  other needs were reported at this time by patient.   Referral has been sent to [facility], [facility], [facility] for review. LCSW will speak with patient to see if he would be interested in being reconsidered for [facility].   Fernande Boyden, LCSW Clinical Social Worker [facility] Ph: [phone]

## 2022-10-15 DIAGNOSIS — F1410 Cocaine abuse, uncomplicated: Secondary | ICD-10-CM | POA: Diagnosis not present

## 2022-10-15 DIAGNOSIS — Z6372 Alcoholism and drug addiction in family: Secondary | ICD-10-CM | POA: Diagnosis not present

## 2022-10-15 DIAGNOSIS — F1010 Alcohol abuse, uncomplicated: Secondary | ICD-10-CM | POA: Diagnosis not present

## 2022-10-15 DIAGNOSIS — F1914 Other psychoactive substance abuse with psychoactive substance-induced mood disorder: Secondary | ICD-10-CM | POA: Diagnosis not present

## 2022-10-15 NOTE — ED Notes (Signed)
Pt is attending AA and group. 

## 2022-10-15 NOTE — Group Note (Signed)
Group Topic: Understanding Self  Group Date: 10/15/2022 Start Time: 1100 End Time: 1115 Facilitators: Vonzell Schlatter B  Department: [facility]  Number of Participants: 7  Group Focus: self-awareness and social skills Treatment Modality:  Psychoeducation Interventions utilized were story telling, group exercise Purpose: express feelings and increase insight  Name: Philip Collins Date of Birth: 1965/07/15  MR: [id number]    Level of Participation: active Quality of Participation: attentive and cooperative Interactions with others: gave feedback Mood/Affect: positive Triggers (if applicable):na Cognition: coherent/clear Progress: Moderate Response: na Plan: follow-up needed  Patients Problems:  Patient Active Problem List   Diagnosis Date Noted   Cocaine use disorder 10/12/2022   Hemorrhoids 09/20/2022   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

## 2022-10-15 NOTE — ED Notes (Signed)
Patient currently in the day room without any observed distress. Patient conversing with other patients and watching TV. Safety maintained and will continue to monitor.

## 2022-10-15 NOTE — [facility] Group Notes (Signed)
[facility] LCSW Group Therapy Note  Date/Time: 10/15/2022 at 1:30pm  Type of Therapy/Topic:  Group Therapy:  Balance in Life  Participation Level:  Active  Description of Group:   This group will address the importance of considering the journey and not just the destination. Patients will be encouraged to process areas in their lives where they found it hard to focus on the good rather than the bad, and identify reasons for maintaining such thought pattern. Facilitator will guide patients utilizing problem- solving interventions to address and improve the way each patient views themselves and their situation. Patients will work through understanding and applying humility when it comes to life changes and the interactions we have with others. Patients will be encouraged to explore ways that they will move forward throughout life's journey, and make healthier decisions for themselves.   Therapeutic Goals: Patient will identify two or more emotions or situations that they observed or felt throughout watching the video clip. Patient will identify signs where they may have responded to someone or situation due to their current circumstance.  Patient will identify two ways to set better habits in order to achieve more peace in their lives. Patient will demonstrate ability to communicate their needs through discussion and/or role plays.  Summary of Patient Progress: Patient actively participated in group on today. Patient was able to identify areas or times in his life where he felt like it was harder to focus on the good rather than the negative. Patient reports that the video clip definitely made him reflect on the importance of being humble. Patient reports he has a hard time not thinking about his life being predestined to be the way it is due to family history. Philip Collins supportive counseling was provided to the patient and he was receptive. Patient reports feeling encouraged from watching the video, and his  was able to provide feedback to staff and peers.    Therapeutic Modalities:   Cognitive Behavioral Therapy Solution-Focused Therapy Assertiveness Training  Fernande Boyden, [place] Clinical Social Worker [facility] Ph: [phone]

## 2022-10-15 NOTE — ED Notes (Signed)
Patient observed/assessed in room in bed appearing in no immediate distress resting peacefully. Q15 minute checks continued by [facility] and nursing staff. Will continue to monitor and support. 
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
## 2022-10-15 NOTE — Group Note (Signed)
Group Topic: Fears and Unhealthy Coping Skills  Group Date: 10/15/2022 Start Time: 1130 End Time: 1215 Facilitators: Merrie Roof, RN  Department: [facility]  Number of Participants: 7  Group Focus: coping skills Treatment Modality:  Patient-Centered Therapy Interventions utilized were group exercise Purpose: express feelings  Name: Philip Collins Date of Birth: 26-Dec-1965  MR: [id number]    Level of Participation: active Quality of Participation: cooperative Interactions with others: gave feedback Mood/Affect: appropriate Triggers (if applicable):  Cognition: coherent/clear Progress: Significant Response:  Plan: patient will be encouraged to continue with therapy  Patients Problems:  Patient Active Problem List   Diagnosis Date Noted   Cocaine use disorder 10/12/2022   Hemorrhoids 09/20/2022   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

## 2022-10-15 NOTE — ED Notes (Signed)
Patient accepted scheduled meds w/o difficulty. Out of bed to day room for breakfast and for meds. Denied SI/HI/AVH. Safety maintained and will continue to monitor.

## 2022-10-16 DIAGNOSIS — F1410 Cocaine abuse, uncomplicated: Secondary | ICD-10-CM | POA: Diagnosis not present

## 2022-10-16 DIAGNOSIS — F1010 Alcohol abuse, uncomplicated: Secondary | ICD-10-CM | POA: Diagnosis not present

## 2022-10-16 DIAGNOSIS — Z6372 Alcoholism and drug addiction in family: Secondary | ICD-10-CM | POA: Diagnosis not present

## 2022-10-16 DIAGNOSIS — F1914 Other psychoactive substance abuse with psychoactive substance-induced mood disorder: Secondary | ICD-10-CM | POA: Diagnosis not present

## 2022-10-16 NOTE — ED Notes (Signed)
Patient was provided with dinner 

## 2022-10-16 NOTE — Discharge Planning (Signed)
LCSW followed up with [facility] regarding referral. Per Arlita, have patient call in to complete phone screening. Number provided to patient for his follow up. Patient was also provided a list of [facility] in [place], per his request. Patient is still pending review at [facility].   Fernande Boyden, LCSW Clinical Social Worker [facility] Ph: [phone]

## 2022-10-16 NOTE — ED Notes (Signed)
Patient was provided with breakfast 

## 2022-10-16 NOTE — ED Notes (Signed)
Pt in dayroom with peers for group. No noted distress. Will continue to monitor for safety 

## 2022-10-16 NOTE — ED Notes (Signed)
Patient accepted scheduled meds w/o difficulty. Out of bed to day room for breakfast and group meeting. Converses with staff and other patients on the unit. Safety maintained and will continue to monitor.

## 2022-10-16 NOTE — ED Notes (Signed)
Pt attended AA and group

## 2022-10-16 NOTE — ED Notes (Signed)
Pt ao, denies SI/HI/AVH.  Denies s/s of withdrawal. Pleasant, engaged with staff and peers. No noted distress. Will continue to monitor for safety

## 2022-10-16 NOTE — ED Provider Notes (Signed)
Behavioral Health Progress Note  Date and Time: 10/16/2022 4:53 PM Name: Philip Collins MRN:  [id number]  Subjective:   Philip Collins is a 57 year old male with a psychiatric history of alcohol abuse and cocaine abuse presented voluntarily to [facility].  Patient relapsed on crack cocaine and alcohol after being sober for 1 year and is experiencing worsening depression and anxiety symptoms. Patient denies any SI HI or AVH.   On Assessment Today: Pt states his mood has improved after attending groups yesterday, as the topics spoke to his current situation. He slept well last night, and now realizes that his sleep has been poor due to overnight work schedule. Pt reports fair appetite. Again discussed starting SSRI for depression in the short term while he is seeking treatment and will not have a therapist; patient declined, stating that his mood is improving as his mindset improves. Currently, pt denies any active suicidal ideation, homicidal ideation and, visual and auditory hallucination.  Pt denies any further concerns. He is motivated for residential substance use treatment followed by an [employer].   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 Past Medical History: Denies any significant medical issues Family History: Father-alcoholism, Brother-alcoholism Social History: 1 y/o divorced, relapsed after being 1-year sober, works at the [employer]  Diagnosis:  Final diagnoses:  Alcohol use disorder  Cocaine use disorder  Substance induced mood disorder    Total Time spent with patient: 30 minutes   Additional Social History:    Pain Medications: Denies abuse Prescriptions: Denies abuse Over the Counter: Denies abuse History of alcohol / drug use?: Yes Longest period of sobriety (when/how long): 1 year, relapsed yesterday Negative Consequences of Use: Financial, Personal relationships Withdrawal Symptoms: None Name of Substance 1: Alcohol 1 - Age of First  Use: Adolescent 1 - Amount (size/oz): Varied 1 - Frequency: Was drinking daily 1 - Duration: Ongoing 1 - Last Use / Amount: 10/11/2022, unknown amount 1 - Method of Aquiring: Purchase 1- Route of Use: Oral ingestion Name of Substance 2: Cocaine 2 - Age of First Use: Adolescent 2 - Amount (size/oz): Varies 2 - Frequency: Daily when available 2 - Duration: Ongoing 2 - Last Use / Amount: 10/11/2022, unknown amount 2 - Method of Aquiring: Unknown 2 - Route of Substance Use: Smoke inhalation                Sleep: Fair  Appetite:  Fair  Current Medications:  Current Facility-Administered Medications  Medication Dose Route Frequency Provider Last Rate Last Admin   acetaminophen (TYLENOL) tablet 650 mg  650 mg Oral Q6H PRN Bobbitt, Shalon E, NP       alum & mag hydroxide-simeth (MAALOX/MYLANTA) 200-200-20 MG/5ML suspension 30 mL  30 mL Oral Q4H PRN Bobbitt, Shalon E, NP       hydrOXYzine (ATARAX) tablet 25 mg  25 mg Oral Q6H PRN Bobbitt, Shalon E, NP       loperamide (IMODIUM) capsule 2-4 mg  2-4 mg Oral PRN Bobbitt, Shalon E, NP       magnesium hydroxide (MILK OF MAGNESIA) suspension 30 mL  30 mL Oral Daily PRN Bobbitt, Shalon E, NP       multivitamin with minerals tablet 1 tablet  1 tablet Oral Daily Doda, Vandana, MD   1 tablet at 10/16/22 0926   ondansetron (ZOFRAN-ODT) disintegrating tablet 4 mg  4 mg Oral Q6H PRN Bobbitt, Shalon E, NP       thiamine (VITAMIN B1) tablet 100 mg  100 mg  Oral Daily Karsten Ro, MD   100 mg at 10/16/22 0926   traZODone (DESYREL) tablet 50 mg  50 mg Oral QHS PRN Bobbitt, Shalon E, NP       No current outpatient medications on file.    Labs  Lab Results:  Admission on 10/12/2022  Component Date Value Ref Range Status   WBC 10/12/2022 6.3  4.0 - 10.5 K/uL Final   RBC 10/12/2022 5.26  4.22 - 5.81 MIL/uL Final   Hemoglobin 10/12/2022 16.4  13.0 - 17.0 g/dL Final   HCT 73/53/2992 46.4  39.0 - 52.0 % Final   MCV 10/12/2022 88.2  80.0 - 100.0 fL  Final   MCH 10/12/2022 31.2  26.0 - 34.0 pg Final   MCHC 10/12/2022 35.3  30.0 - 36.0 g/dL Final   RDW 42/68/3419 12.2  11.5 - 15.5 % Final   Platelets 10/12/2022 209  150 - 400 K/uL Final   nRBC 10/12/2022 0.0  0.0 - 0.2 % Final   Neutrophils Relative % 10/12/2022 52  % Final   Neutro Abs 10/12/2022 3.3  1.7 - 7.7 K/uL Final   Lymphocytes Relative 10/12/2022 38  % Final   Lymphs Abs 10/12/2022 2.4  0.7 - 4.0 K/uL Final   Monocytes Relative 10/12/2022 8  % Final   Monocytes Absolute 10/12/2022 0.5  0.1 - 1.0 K/uL Final   Eosinophils Relative 10/12/2022 1  % Final   Eosinophils Absolute 10/12/2022 0.1  0.0 - 0.5 K/uL Final   Basophils Relative 10/12/2022 1  % Final   Basophils Absolute 10/12/2022 0.0  0.0 - 0.1 K/uL Final   Immature Granulocytes 10/12/2022 0  % Final   Abs Immature Granulocytes 10/12/2022 0.00  0.00 - 0.07 K/uL Final   Performed at [facility], [address] [place], [place] [address]   Sodium 10/12/2022 140  135 - 145 mmol/L Final   Potassium 10/12/2022 3.7  3.5 - 5.1 mmol/L Final   Chloride 10/12/2022 100  98 - 111 mmol/L Final   CO2 10/12/2022 26  22 - 32 mmol/L Final   Glucose, Bld 10/12/2022 90  70 - 99 mg/dL Final   Glucose reference range applies only to samples taken after fasting for at least 8 hours.   BUN 10/12/2022 15  6 - 20 mg/dL Final   Creatinine, Ser 10/12/2022 0.95  0.61 - 1.24 mg/dL Final   Calcium 79/89/2119 10.1  8.9 - 10.3 mg/dL Final   Total Protein 41/74/0814 8.1  6.5 - 8.1 g/dL Final   Albumin 48/18/5631 4.6  3.5 - 5.0 g/dL Final   AST 49/70/2637 39  15 - 41 U/L Final   ALT 10/12/2022 23  0 - 44 U/L Final   Alkaline Phosphatase 10/12/2022 81  38 - 126 U/L Final   Total Bilirubin 10/12/2022 1.8 (H)  0.3 - 1.2 mg/dL Final   GFR, Estimated 10/12/2022 >60  >60 mL/min Final   Comment: (NOTE) Calculated using the CKD-EPI Creatinine Equation (2021)    Anion gap 10/12/2022 14  5 - 15 Final   Performed at [facility], [address], [place], [place] [address]   Alcohol, Ethyl (B) 10/12/2022 <10  <10 mg/dL Final   Comment: (NOTE) Lowest detectable limit for serum alcohol is 10 mg/dL.  For medical purposes only. Performed at [facility], [address], [place], [place] [address]    POC Amphetamine UR 10/13/2022 None Detected  NONE DETECTED (Cut Off Level 1000 ng/mL) Final  POC Secobarbital (BAR) 10/13/2022 None Detected  NONE DETECTED (Cut Off Level 300 ng/mL) Final   POC Buprenorphine (BUP) 10/13/2022 None Detected  NONE DETECTED (Cut Off Level 10 ng/mL) Final   POC Oxazepam (BZO) 10/13/2022 None Detected  NONE DETECTED (Cut Off Level 300 ng/mL) Final   POC Cocaine UR 10/13/2022 Positive (A)  NONE DETECTED (Cut Off Level 300 ng/mL) Final   POC Methamphetamine UR 10/13/2022 None Detected  NONE DETECTED (Cut Off Level 1000 ng/mL) Final   POC Morphine 10/13/2022 None Detected  NONE DETECTED (Cut Off Level 300 ng/mL) Final   POC Methadone UR 10/13/2022 None Detected  NONE DETECTED (Cut Off Level 300 ng/mL) Final   POC Oxycodone UR 10/13/2022 None Detected  NONE DETECTED (Cut Off Level 100 ng/mL) Final   POC Marijuana UR 10/13/2022 None Detected  NONE DETECTED (Cut Off Level 50 ng/mL) Final   SARSCOV2ONAVIRUS 2 AG 10/12/2022 NEGATIVE  NEGATIVE Final   Comment: (NOTE) SARS-CoV-2 antigen NOT DETECTED.   Negative results are presumptive.  Negative results do not preclude SARS-CoV-2 infection and should not be used as the sole basis for treatment or other patient management decisions, including infection  control decisions, particularly in the presence of clinical signs and  symptoms consistent with COVID-19, or in those who have been in contact with the virus.  Negative results must be combined with clinical observations, patient history, and epidemiological information. The expected result is Negative.  Fact Sheet for Patients: https://www.jennings-kim.com/  Fact Sheet for Healthcare  Providers: https://alexander-rogers.biz/  This test is not yet approved or cleared by the [place] FDA and  has been authorized for detection and/or diagnosis of SARS-CoV-2 by FDA under an Emergency Use Authorization (EUA).  This EUA will remain in effect (meaning this test can be used) for the duration of  the COV                          ID-19 declaration under Section 564(b)(1) of the Act, 21 U.S.C. section 360bbb-3(b)(1), unless the authorization is terminated or revoked sooner.     Cholesterol 10/12/2022 162  0 - 200 mg/dL Final   Triglycerides 95/28/4132 66  <150 mg/dL Final   HDL 44/07/270 70  >40 mg/dL Final   Total CHOL/HDL Ratio 10/12/2022 2.3  RATIO Final   VLDL 10/12/2022 13  0 - 40 mg/dL Final   LDL Cholesterol 10/12/2022 79  0 - 99 mg/dL Final   Comment:        Total Cholesterol/HDL:CHD Risk Coronary Heart Disease Risk Table                     Men   Women  1/2 Average Risk   3.4   3.3  Average Risk       5.0   4.4  2 X Average Risk   9.6   7.1  3 X Average Risk  23.4   11.0        Use the calculated Patient Ratio above and the CHD Risk Table to determine the patient's CHD Risk.        ATP III CLASSIFICATION (LDL):  <100     mg/dL   Optimal  536-644  mg/dL   Near or Above                    Optimal  130-159  mg/dL   Borderline  034-742  mg/dL   High  >595  mg/dL   Very High Performed at [facility], [address] [place], [place] [address]    SARS Coronavirus 2 by RT PCR 10/12/2022 NEGATIVE  NEGATIVE Final   Performed at [facility], [address], [place], [place] [address]  Office Visit on 04/23/2022  Component Date Value Ref Range Status   Hemoglobin A1C 04/23/2022 5.9 (A)  4.0 - 5.6 % Final   POC Glucose 04/23/2022 115 (A)  70 - 99 mg/dl Final    Blood Alcohol level:  Lab Results  Component Value Date   ETH <10 10/12/2022    Metabolic Disorder Labs: Lab Results  Component Value Date   HGBA1C 5.9 (A)  04/23/2022   No results found for: "PROLACTIN" Lab Results  Component Value Date   CHOL 162 10/12/2022   TRIG 66 10/12/2022   HDL 70 10/12/2022   CHOLHDL 2.3 10/12/2022   VLDL 13 10/12/2022   LDLCALC 79 10/12/2022   LDLCALC 81 01/15/2022    Therapeutic Lab Levels: No results found for: "LITHIUM" No results found for: "VALPROATE" No results found for: "CBMZ"  Physical Findings   PHQ2-9    Flowsheet Row ED from 10/12/2022 in [facility] Office Visit from 09/20/2022 in [facility] Office Visit from 01/15/2022 in [facility]  PHQ-2 Total Score 1 0 0  PHQ-9 Total Score 5 0 --      Flowsheet Row ED from 10/12/2022 in [facility] Ad[facility] (Discharged) from 03/20/2022 in [facility] ED from 10/04/2021 in [facility] Emergency Department at [facility]  C-SSRS RISK CATEGORY No Risk No Risk No Risk        Musculoskeletal  Strength & Muscle Tone: within normal limits Gait & Station: normal Patient leans: N/A  Psychiatric Specialty Exam  Presentation  General Appearance:  Appropriate for Environment  Eye Contact: Good  Speech: Clear and Coherent  Speech Volume: Normal  Handedness: Right   Mood and Affect  Mood: Depressed But less  Affect: Congruent   Thought Process  Thought Processes: Coherent; Goal Directed  Descriptions of Associations:Intact  Orientation:Full (Time, Place and Person)  Thought Content:Logical; WDL  Diagnosis of Schizophrenia or Schizoaffective disorder in past: No    Hallucinations:Hallucinations: None   Ideas of Reference:None  Suicidal Thoughts:Suicidal Thoughts: No   Homicidal Thoughts:Homicidal Thoughts: No    Sensorium  Memory: Immediate Good; Recent Fair  Judgment: Fair  Insight: Good   Executive Functions  Concentration: Good  Attention Span: Good  Recall: Good  Fund of  Knowledge: Good  Language: Good   Psychomotor Activity  Psychomotor Activity: Psychomotor Activity: Normal    Assets  Assets: Communication Skills; Desire for Improvement; Financial Resources/Insurance; Physical Health; Resilience; Social Support   Sleep  Sleep: Sleep: Fair    Physical Exam  Physical Exam Vitals reviewed.  Constitutional:      Appearance: Normal appearance.  Pulmonary:     Effort: Pulmonary effort is normal.  Neurological:     General: No focal deficit present.     Mental Status: He is alert and oriented to person, place, and time.    Review of Systems  Constitutional:  Negative for fever.  Respiratory:  Negative for cough.   Cardiovascular:  Negative for chest pain.  Musculoskeletal:  Negative for myalgias.  Neurological:  Negative for dizziness and headaches.  Psychiatric/Behavioral:  Negative for depression.    Blood pressure 106/70, pulse 64, temperature 97.9  F (36.6 C), temperature source Oral, resp. rate 18, SpO2 100 %. There is no height or weight on file to calculate BMI.  Treatment Plan Summary: Rosary Livelyrent Lemieux is a 57 year old male with a psychiatric history of alcohol abuse and cocaine abuse presenting [facility] to [facility].  Patient reports relapsing on crack cocaine and alcohol after being sober for 1 year and male is experiencing worsening depression and anxiety symptoms. Patient denies any SI HI or AVH. Abnormal labs-UDS-cocaine positive, total bilirubin 1.8, HbA1c 5.9, glucose 115 Patient remains motivated for residential substance use treatment.  Daily contact with patient to assess and evaluate symptoms and progress in treatment Alcohol use -Discontinue CIWA with PRN Ativan for CIWA more than 10, as score = 0 x 48 hours. -Continue vitamin B1, multivitamin -Continue Zofran, trazodone, MOM, Imodium, hydroxyzine, Maalox, Tylenol -Offered naltrexone but patient declined  Cocaine use -Patient interested in inpatient rehab; SW to  assist with placement. -Discontinue COWS as score only due to slight resting heart rate elevation.  Substance induced mood disorder Offered SSRI but patient again declined after reconsideration  Dispo-Pending  Lamar Sprinklesourtney Winslow Ederer, MD 10/16/2022 4:53 PM

## 2022-10-16 NOTE — ED Notes (Signed)
Pt sitting in dayroom interacting with peers. No acute distress noted. No concerns voiced. Informed pt to notify staff with any needs or assistance. Pt verbalized understanding or agreement. Will continue to monitor for safety. 

## 2022-10-16 NOTE — ED Notes (Signed)
Patient was provided with lunch 

## 2022-10-16 NOTE — Group Note (Signed)
Group Topic: Social Support  Group Date: 10/16/2022 Start Time: 1000 End Time: 1100 Facilitators: Maeola Sarah  Department: [facility]  Number of Participants: 5  Group Focus: social skills Treatment Modality:  Psychoeducation Interventions utilized were group exercise and support Purpose: enhance coping skills and improve communication skills  Name: Philip Collins Date of Birth: Oct 30, 1965  MR: [id number]    Level of Participation: active Quality of Participation: attentive, cooperative, and engaged Interactions with others: gave feedback Mood/Affect: appropriate and positive Triggers (if applicable): N/A Cognition: coherent/clear and logical Progress: Minimal Response: Patient shared that he has to work on asking for support and limit the amount of information that he shares with his sister and kids. Plan: patient will be encouraged to continue to attend groups  Patients Problems:  Patient Active Problem List   Diagnosis Date Noted   Cocaine use disorder 10/12/2022   Hemorrhoids 09/20/2022   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

## 2022-10-16 NOTE — ED Notes (Signed)
Pt is currently sleeping, no distress noted, environmental check complete, will continue to monitor patient for safety.  

## 2022-10-17 DIAGNOSIS — F1010 Alcohol abuse, uncomplicated: Secondary | ICD-10-CM | POA: Diagnosis not present

## 2022-10-17 DIAGNOSIS — F1914 Other psychoactive substance abuse with psychoactive substance-induced mood disorder: Secondary | ICD-10-CM | POA: Diagnosis not present

## 2022-10-17 DIAGNOSIS — F1410 Cocaine abuse, uncomplicated: Secondary | ICD-10-CM | POA: Diagnosis not present

## 2022-10-17 DIAGNOSIS — Z6372 Alcoholism and drug addiction in family: Secondary | ICD-10-CM | POA: Diagnosis not present

## 2022-10-17 NOTE — ED Notes (Signed)
Patient is watching tv in the dayroom. Environment secured. Alert and oriented. Will continue to monitor for safety. 

## 2022-10-17 NOTE — Group Note (Signed)
Group Topic: Understanding Self  Group Date: 10/17/2022 Start Time: 1000 End Time: 1100 Facilitators: Londell Moh, NT  Department: [facility]  Number of Participants: 5  Group Focus: acceptance, clarity of thought, communication, coping skills, healthy friendships, personal responsibility, self-awareness, and self-esteem Treatment Modality:  Patient-Centered Therapy Interventions utilized were clarification, patient education, and support Purpose: enhance coping skills, express feelings, improve communication skills, increase insight, regain self-worth, and reinforce self-care  Name: Philip Collins Date of Birth: December 19, 1965  MR: [id number]    Level of Participation: minimal Quality of Participation: attentive, cooperative, engaged, and quiet Interactions with others: Active listener Mood/Affect: closed / guarded Triggers (if applicable): n/a Cognition: processing slowly Progress: Gaining insight Response: Gained knowledge from group Plan: patient will be encouraged to continue to attend groups  Patients Problems:  Patient Active Problem List   Diagnosis Date Noted   Cocaine use disorder 10/12/2022   Hemorrhoids 09/20/2022   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

## 2022-10-17 NOTE — Group Note (Signed)
Group Topic: Communication  Group Date: 10/17/2022 Start Time: 1115 End Time: 1200 Facilitators: Merrie Roof, RN  Department: [facility]  Number of Participants: 6  Group Focus: safety plan Treatment Modality:  Patient-Centered Therapy Interventions utilized were assignment Purpose: enhance coping skills  Name: Philip Collins Date of Birth: April 02, 1966  MR: [id number]    Level of Participation: active Quality of Participation: cooperative Interactions with others: gave feedback Mood/Affect: appropriate Triggers (if applicable):  Cognition: coherent/clear Progress: Significant Response:  Plan: patient will be encouraged to continue with therapy.  Patients Problems:  Patient Active Problem List   Diagnosis Date Noted   Cocaine use disorder 10/12/2022   Hemorrhoids 09/20/2022   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

## 2022-10-17 NOTE — ED Notes (Signed)
Patient is in the group room with staff having a group meeting. Alert,will continue to monitor for safety. 

## 2022-10-17 NOTE — ED Notes (Signed)
Patient resting quietly in bed with eyes closed, Respirations equal and unlabored, skin warm and dry, NAD. Routine safety checks conducted according to facility protocol. Will continue to monitor for safety. 

## 2022-10-17 NOTE — ED Notes (Signed)
Patient is sleeping. Respirations equal and unlabored, skin warm and dry. No change in assessment or acuity. Routine safety checks conducted according to facility protocol. Will continue to monitor for safety.   

## 2022-10-17 NOTE — [facility] Group Notes (Signed)
[facility] LCSW Group Therapy   Date/ Time: 10/17/2022 at 1:35PM  Type of Therapy:  Group Therapy  Participation Level:  Active  Participation Quality:  Appropriate  Affect:  Appropriate  Cognitive:  Appropriate  Insight:  Developing/Improving  Engagement in Therapy:  Developing/Improving  Modes of Intervention:  Activity, Discussion, Rapport Building, Socialization and Support  Summary of Progress/Problems: Patient actively participated in group on today. Group started off with introductions and group rules. Group members participated in a therapeutic activity that required active listening and communication skills. Group members were able to identify similarities and differences within the group. Patient interacted positively with staff and peers. No issues to report.   Jull Harral, LCSW Clinical Social Worker [place] Ph: [phone] 

## 2022-10-17 NOTE — ED Notes (Signed)
Patient was provided with breakfast 

## 2022-10-17 NOTE — ED Notes (Signed)
Patient was provided with lunch 

## 2022-10-17 NOTE — ED Notes (Signed)
Patient was provided with dinner 

## 2022-10-17 NOTE — ED Notes (Signed)
Patient alert and oriented x 3. Denies SI/HI/AVH. Denies intent or plan to harm self or others. Routine conducted according to faculty protocol. Encourage patient to notify staff with any needs or concerns. Patient verbalized agreement and understanding. Will continue to monitor for safety. 

## 2022-10-17 NOTE — Discharge Planning (Signed)
LCSW spoke with patient and informed him that he was denied from [facility] due to their recommendation being outpatient treatment. Patient was also denied from [facility] as he has [place] Medicaid. Patient was provided the list of [place] [facility] on yesterday for his follow, and patient stated "I guess I will have to start searching for an [employer]". Brief supportive counseling was provided to the patient and he was receptive to the feedback provided. Patient aware that there will be an anticipated discharge date of Monday so that he has time to follow up with [facility] regarding availability. Patient expressed understanding and appreciation for LCSW assistance. LCSW explored if patient considered returning back to [facility] in [place] and patient stated "no, that was my old stomping ground and that would not be a good area for me". LCSW expressed understanding and informed patient that LCSW will follow up for updates shortly. No other needs were reported at this time.   LCSW will continue to follow and provide support to patient while in [facility].   Fernande Boyden, LCSW Clinical Social Worker [facility] Ph: [phone]
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
## 2022-10-18 DIAGNOSIS — F1010 Alcohol abuse, uncomplicated: Secondary | ICD-10-CM | POA: Diagnosis not present

## 2022-10-18 DIAGNOSIS — Z6372 Alcoholism and drug addiction in family: Secondary | ICD-10-CM | POA: Diagnosis not present

## 2022-10-18 DIAGNOSIS — F1914 Other psychoactive substance abuse with psychoactive substance-induced mood disorder: Secondary | ICD-10-CM | POA: Diagnosis not present

## 2022-10-18 DIAGNOSIS — F1410 Cocaine abuse, uncomplicated: Secondary | ICD-10-CM | POA: Diagnosis not present

## 2022-10-18 MED ORDER — NICOTINE POLACRILEX 2 MG MT GUM: 2.0000 mg | CHEWING_GUM | OROMUCOSAL | Status: DC | PRN

## 2022-10-18 NOTE — ED Provider Notes (Signed)
Behavioral Health Progress Note  Date and Time: 10/18/2022 2:57 PM Name: Philip Collins MRN:  [id number]  Subjective:   Philip Collins is a 57 year old male with a psychiatric history of alcohol abuse and cocaine abuse presented voluntarily to [facility].  Patient relapsed on crack cocaine and alcohol after being sober for 1 year and is experiencing worsening depression and anxiety symptoms. Patient denies any SI HI or AVH.   On Assessment Today: Pt is mostly in good spirits but somewhat down due to denial at [facility]. He does remain motivated to continue to try other houses, however. He slept well last night, and his appetite continues to improve. Currently, pt denies any active suicidal ideation, homicidal ideation and, visual and auditory hallucination.  Pt denies any further concerns.  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 Past Medical History: Denies any significant medical issues Family History: Father-alcoholism, Brother-alcoholism Social History: 89 y/o divorced, relapsed after being 1-year sober, works at the [employer]  Diagnosis:  Final diagnoses:  Alcohol use disorder  Cocaine use disorder  Substance induced mood disorder    Total Time spent with patient: 30 minutes   Additional Social History:    Pain Medications: Denies abuse Prescriptions: Denies abuse Over the Counter: Denies abuse History of alcohol / drug use?: Yes Longest period of sobriety (when/how long): 1 year, relapsed yesterday Negative Consequences of Use: Financial, Personal relationships Withdrawal Symptoms: None Name of Substance 1: Alcohol 1 - Age of First Use: Adolescent 1 - Amount (size/oz): Varied 1 - Frequency: Was drinking daily 1 - Duration: Ongoing 1 - Last Use / Amount: 10/11/2022, unknown amount 1 - Method of Aquiring: Purchase 1- Route of Use: Oral ingestion Name of Substance 2: Cocaine 2 - Age of First Use: Adolescent 2 - Amount (size/oz): Varies 2 -  Frequency: Daily when available 2 - Duration: Ongoing 2 - Last Use / Amount: 10/11/2022, unknown amount 2 - Method of Aquiring: Unknown 2 - Route of Substance Use: Smoke inhalation                Sleep: Good  Appetite:  Fair, improving  Current Medications:  Current Facility-Administered Medications  Medication Dose Route Frequency Provider Last Rate Last Admin   acetaminophen (TYLENOL) tablet 650 mg  650 mg Oral Q6H PRN Bobbitt, Shalon E, NP       alum & mag hydroxide-simeth (MAALOX/MYLANTA) 200-200-20 MG/5ML suspension 30 mL  30 mL Oral Q4H PRN Bobbitt, Shalon E, NP       magnesium hydroxide (MILK OF MAGNESIA) suspension 30 mL  30 mL Oral Daily PRN Bobbitt, Shalon E, NP       multivitamin with minerals tablet 1 tablet  1 tablet Oral Daily Karsten Ro, MD   1 tablet at 10/18/22 0905   nicotine polacrilex (NICORETTE) gum 2 mg  2 mg Oral PRN Lamar Sprinkles, MD       thiamine (VITAMIN B1) tablet 100 mg  100 mg Oral Daily Leone Haven, Vandana, MD   100 mg at 10/18/22 0905   traZODone (DESYREL) tablet 50 mg  50 mg Oral QHS PRN Bobbitt, Shalon E, NP       No current outpatient medications on file.    Labs  Lab Results:  Admission on 10/12/2022  Component Date Value Ref Range Status   WBC 10/12/2022 6.3  4.0 - 10.5 K/uL Final   RBC 10/12/2022 5.26  4.22 - 5.81 MIL/uL Final   Hemoglobin 10/12/2022 16.4  13.0 - 17.0 g/dL Final  HCT 10/12/2022 46.4  39.0 - 52.0 % Final   MCV 10/12/2022 88.2  80.0 - 100.0 fL Final   MCH 10/12/2022 31.2  26.0 - 34.0 pg Final   MCHC 10/12/2022 35.3  30.0 - 36.0 g/dL Final   RDW 84/69/6295 12.2  11.5 - 15.5 % Final   Platelets 10/12/2022 209  150 - 400 K/uL Final   nRBC 10/12/2022 0.0  0.0 - 0.2 % Final   Neutrophils Relative % 10/12/2022 52  % Final   Neutro Abs 10/12/2022 3.3  1.7 - 7.7 K/uL Final   Lymphocytes Relative 10/12/2022 38  % Final   Lymphs Abs 10/12/2022 2.4  0.7 - 4.0 K/uL Final   Monocytes Relative 10/12/2022 8  % Final   Monocytes  Absolute 10/12/2022 0.5  0.1 - 1.0 K/uL Final   Eosinophils Relative 10/12/2022 1  % Final   Eosinophils Absolute 10/12/2022 0.1  0.0 - 0.5 K/uL Final   Basophils Relative 10/12/2022 1  % Final   Basophils Absolute 10/12/2022 0.0  0.0 - 0.1 K/uL Final   Immature Granulocytes 10/12/2022 0  % Final   Abs Immature Granulocytes 10/12/2022 0.00  0.00 - 0.07 K/uL Final   Performed at [facility], [address] [place], [place] [address]   Sodium 10/12/2022 140  135 - 145 mmol/L Final   Potassium 10/12/2022 3.7  3.5 - 5.1 mmol/L Final   Chloride 10/12/2022 100  98 - 111 mmol/L Final   CO2 10/12/2022 26  22 - 32 mmol/L Final   Glucose, Bld 10/12/2022 90  70 - 99 mg/dL Final   Glucose reference range applies only to samples taken after fasting for at least 8 hours.   BUN 10/12/2022 15  6 - 20 mg/dL Final   Creatinine, Ser 10/12/2022 0.95  0.61 - 1.24 mg/dL Final   Calcium 24/40/1027 10.1  8.9 - 10.3 mg/dL Final   Total Protein 25/36/6440 8.1  6.5 - 8.1 g/dL Final   Albumin 34/74/2595 4.6  3.5 - 5.0 g/dL Final   AST 63/87/5643 39  15 - 41 U/L Final   ALT 10/12/2022 23  0 - 44 U/L Final   Alkaline Phosphatase 10/12/2022 81  38 - 126 U/L Final   Total Bilirubin 10/12/2022 1.8 (H)  0.3 - 1.2 mg/dL Final   GFR, Estimated 10/12/2022 >60  >60 mL/min Final   Comment: (NOTE) Calculated using the CKD-EPI Creatinine Equation (2021)    Anion gap 10/12/2022 14  5 - 15 Final   Performed at [facility], [address] [place], [place] [address]   Alcohol, Ethyl (B) 10/12/2022 <10  <10 mg/dL Final   Comment: (NOTE) Lowest detectable limit for serum alcohol is 10 mg/dL.  For medical purposes only. Performed at [facility], [address] [place], [place] [address]    POC Amphetamine UR 10/13/2022 None Detected  NONE DETECTED (Cut Off Level 1000 ng/mL) Final   POC Secobarbital (BAR) 10/13/2022 None Detected  NONE DETECTED (Cut Off Level 300 ng/mL) Final   POC Buprenorphine (BUP)  10/13/2022 None Detected  NONE DETECTED (Cut Off Level 10 ng/mL) Final   POC Oxazepam (BZO) 10/13/2022 None Detected  NONE DETECTED (Cut Off Level 300 ng/mL) Final   POC Cocaine UR 10/13/2022 Positive (A)  NONE DETECTED (Cut Off Level 300 ng/mL) Final   POC Methamphetamine UR 10/13/2022 None Detected  NONE DETECTED (Cut Off Level 1000 ng/mL) Final   POC Morphine 10/13/2022 None Detected  NONE DETECTED (  Cut Off Level 300 ng/mL) Final   POC Methadone UR 10/13/2022 None Detected  NONE DETECTED (Cut Off Level 300 ng/mL) Final   POC Oxycodone UR 10/13/2022 None Detected  NONE DETECTED (Cut Off Level 100 ng/mL) Final   POC Marijuana UR 10/13/2022 None Detected  NONE DETECTED (Cut Off Level 50 ng/mL) Final   SARSCOV2ONAVIRUS 2 AG 10/12/2022 NEGATIVE  NEGATIVE Final   Comment: (NOTE) SARS-CoV-2 antigen NOT DETECTED.   Negative results are presumptive.  Negative results do not preclude SARS-CoV-2 infection and should not be used as the sole basis for treatment or other patient management decisions, including infection  control decisions, particularly in the presence of clinical signs and  symptoms consistent with COVID-19, or in those who have been in contact with the virus.  Negative results must be combined with clinical observations, patient history, and epidemiological information. The expected result is Negative.  Fact Sheet for Patients: https://www.jennings-kim.com/  Fact Sheet for Healthcare Providers: https://alexander-rogers.biz/  This test is not yet approved or cleared by the [place] FDA and  has been authorized for detection and/or diagnosis of SARS-CoV-2 by FDA under an Emergency Use Authorization (EUA).  This EUA will remain in effect (meaning this test can be used) for the duration of  the COV                          ID-19 declaration under Section 564(b)(1) of the Act, 21 U.S.C. section 360bbb-3(b)(1), unless the authorization is terminated or  revoked sooner.     Cholesterol 10/12/2022 162  0 - 200 mg/dL Final   Triglycerides 40/98/1191 66  <150 mg/dL Final   HDL 47/82/9562 70  >40 mg/dL Final   Total CHOL/HDL Ratio 10/12/2022 2.3  RATIO Final   VLDL 10/12/2022 13  0 - 40 mg/dL Final   LDL Cholesterol 10/12/2022 79  0 - 99 mg/dL Final   Comment:        Total Cholesterol/HDL:CHD Risk Coronary Heart Disease Risk Table                     Men   Women  1/2 Average Risk   3.4   3.3  Average Risk       5.0   4.4  2 X Average Risk   9.6   7.1  3 X Average Risk  23.4   11.0        Use the calculated Patient Ratio above and the CHD Risk Table to determine the patient's CHD Risk.        ATP III CLASSIFICATION (LDL):  <100     mg/dL   Optimal  130-865  mg/dL   Near or Above                    Optimal  130-159  mg/dL   Borderline  784-696  mg/dL   High  >295     mg/dL   Very High Performed at [facility], [address], [place], [place] [address]    SARS Coronavirus 2 by RT PCR 10/12/2022 NEGATIVE  NEGATIVE Final   Performed at [facility], [address], [place], [place] [address]  Office Visit on 04/23/2022  Component Date Value Ref Range Status   Hemoglobin A1C 04/23/2022 5.9 (A)  4.0 - 5.6 % Final   POC Glucose 04/23/2022 115 (A)  70 - 99 mg/dl Final    Blood Alcohol level:  Lab Results  Component Value Date   ETH <10 10/12/2022    Metabolic Disorder Labs: Lab Results  Component Value Date   HGBA1C 5.9 (A) 04/23/2022   No results found for: "PROLACTIN" Lab Results  Component Value Date   CHOL 162 10/12/2022   TRIG 66 10/12/2022   HDL 70 10/12/2022   CHOLHDL 2.3 10/12/2022   VLDL 13 10/12/2022   LDLCALC 79 10/12/2022   LDLCALC 81 01/15/2022    Therapeutic Lab Levels: No results found for: "LITHIUM" No results found for: "VALPROATE" No results found for: "CBMZ"  Physical Findings   PHQ2-9    Flowsheet Row ED from 10/12/2022 in [facility] Office Visit  from 09/20/2022 in [facility] Office Visit from 01/15/2022 in [facility]  PHQ-2 Total Score 1 0 0  PHQ-9 Total Score 5 0 --      Flowsheet Row ED from 10/12/2022 in [facility] Admission (Discharged) from 03/20/2022 in [facility] ED from 10/04/2021 in [facility]  C-SSRS RISK CATEGORY No Risk No Risk No Risk        Musculoskeletal  Strength & Muscle Tone: within normal limits Gait & Station: normal Patient leans: N/A  Psychiatric Specialty Exam  Presentation  General Appearance:  Appropriate for Environment; Casual  Eye Contact: Good  Speech: Clear and Coherent; Normal Rate  Speech Volume: Normal  Handedness: Right   Mood and Affect  Mood: Euthymic   Affect: Appropriate; Congruent   Thought Process  Thought Processes: Coherent; Linear  Descriptions of Associations:Intact  Orientation:Full (Time, Place and Person)  Thought Content:Logical; WDL  Diagnosis of Schizophrenia or Schizoaffective disorder in past: No    Hallucinations:Hallucinations: None    Ideas of Reference:None  Suicidal Thoughts:Suicidal Thoughts: No    Homicidal Thoughts:Homicidal Thoughts: No     Sensorium  Memory: Immediate Good; Recent Good  Judgment: Good  Insight: Good   Executive Functions  Concentration: Good  Attention Span: Good  Recall: Good  Fund of Knowledge: Good  Language: Good   Psychomotor Activity  Psychomotor Activity: Psychomotor Activity: Normal     Assets  Assets: Communication Skills; Desire for Improvement; Social Support   Sleep  Sleep: Sleep: Fair    Physical Exam  Physical Exam Vitals reviewed.  Constitutional:      Appearance: Normal appearance.  Pulmonary:     Effort: Pulmonary effort is normal.  Neurological:     General: No focal deficit present.     Mental Status:  He is alert and oriented to person, place, and time.    Review of Systems  Constitutional:  Negative for fever.  Respiratory:  Negative for cough.   Cardiovascular:  Negative for chest pain.  Musculoskeletal:  Negative for myalgias.  Neurological:  Negative for dizziness and headaches.  Psychiatric/Behavioral:  Negative for depression.    Blood pressure 106/73, pulse 66, temperature 98.7 F (37.1 C), temperature source Oral, resp. rate 18, SpO2 100 %. There is no height or weight on file to calculate BMI.  Treatment Plan Summary: Boden Stucky is a 57 year old male with a psychiatric history of alcohol abuse and cocaine abuse presenting voluntarily to [facility].  Patient reports relapsing on crack cocaine and alcohol after being sober for 1 year and male is experiencing worsening depression and anxiety symptoms. Patient denies any SI HI or AVH. Abnormal labs-UDS-cocaine positive, total bilirubin 1.8, HbA1c 5.9, glucose 115 Patient no  longer has residential substance use treatment as an option, but he is taking it well and motivated to find placement at an [facility].  Daily contact with patient to assess and evaluate symptoms and progress in treatment Alcohol use -Previously discontinued CIWA with PRN Ativan for CIWA more than 10, as score = 0 x 48 hours. -Continue vitamin B1, multivitamin -Continue Zofran, trazodone, MOM, Imodium, hydroxyzine, Maalox, Tylenol -Offered naltrexone but patient declined  Cocaine use -Counseled on cessation of use -Nicorette gum PRN  Substance induced mood disorder Offered SSRI but patient again declined after reconsideration  Dispo-Pending [facility] decision; discharge Monday.  Lamar Sprinkles, MD 10/18/2022 2:57 PM

## 2022-10-18 NOTE — Discharge Planning (Signed)
LCSW spoke with patient on this morning and he reported some frustration with not getting answers from the [facility]. Patient reports feeling somewhat discouraged, however reports he is going to continue making phone calls. Patient reports if he does not receive an update by 3:00pm, he is going to follow up with his sister and just have her pick him up so that he can get his items from her truck and then reports he will figure it out. Patient declined additional resources for the [facility] stating "they're all cults and I don't want no parts of that". Patient reports he will continue to make his calls and then will follow up from there. Patient aware that additional resources will be provided in his AVS for his review. No other needs were reported at the time of this encounter. Brief counseling was provided to the patient and he was receptive to the feedback provided.   Fernande Boyden, LCSW Clinical Social Worker [facility] Ph: [phone]

## 2022-10-18 NOTE — ED Notes (Signed)
Patient awake and alert sitting in dayroom eating breakfast.  No complaints or distress.  No withdrawal at this time.  Will monitor.

## 2022-10-18 NOTE — Group Note (Signed)
Group Topic: Relaxation  Group Date: 10/18/2022 Start Time: 1100 End Time: 1150 Facilitators: Jenean Lindau, RN  Department: [facility]  Number of Participants: 4  Group Focus: anxiety, relapse prevention, and relaxation Treatment Modality:  Psychoeducation Interventions utilized were patient education and problem solving Purpose: enhance coping skills, explore maladaptive thinking, express feelings, express irrational fears, increase insight, and relapse prevention strategies  Name: Philip Collins Date of Birth: 1965/11/28  MR: [id number]    Level of Participation: active Quality of Participation: attentive and cooperative Interactions with others: gave feedback Mood/Affect: appropriate Triggers (if applicable): n/a Cognition: goal directed and logical Progress: Gaining insight Response: positive Plan: patient will be encouraged to attend group  Patients Problems:  Patient Active Problem List   Diagnosis Date Noted   Cocaine use disorder 10/12/2022   Hemorrhoids 09/20/2022   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

## 2022-10-18 NOTE — ED Notes (Signed)
Patient is awake and alert on unit.  He attended group and expressed frustration with finding aftercare.  Patient also reports feeling overwhelming urge to smoke.  He states he wants a patch and/or gum.  M.D. made aware.  Will continue to provide support.

## 2022-10-18 NOTE — ED Notes (Signed)
Pt is in the dayroom watching TV with peers. Pt denies SI/HI/AVH. No acute distress noted. Will continue to monitor for safety. 

## 2022-10-18 NOTE — ED Notes (Signed)
SPIRITUALITY GROUP NOTE  Spirituality group facilitated by Wilkie Aye, MDiv, BCC.  Group Description: Group focused on topic of hope. Patients participated in facilitated discussion around topic, connecting with one another around experiences and definitions for hope. Group members engaged with visual explorer photos, reflecting on what hope looks like for them today. Group engaged in discussion around how their definitions of hope are present today in hospital.  Modalities: Psycho-social ed, Adlerian, Narrative, MI  Patient Progress: Philip Collins was present and engaged throughout group.  Described his faith as a resources in hope - noting his spiritual perspective keeps him positive and engaging with choices out of values that align with his faith.

## 2022-10-18 NOTE — ED Notes (Signed)
Patient was provided with lunch 

## 2022-10-18 NOTE — ED Notes (Signed)
Patient was provided with dinner 

## 2022-10-19 DIAGNOSIS — F1914 Other psychoactive substance abuse with psychoactive substance-induced mood disorder: Secondary | ICD-10-CM | POA: Diagnosis not present

## 2022-10-19 DIAGNOSIS — F1010 Alcohol abuse, uncomplicated: Secondary | ICD-10-CM | POA: Diagnosis not present

## 2022-10-19 DIAGNOSIS — Z6372 Alcoholism and drug addiction in family: Secondary | ICD-10-CM | POA: Diagnosis not present

## 2022-10-19 DIAGNOSIS — F1410 Cocaine abuse, uncomplicated: Secondary | ICD-10-CM | POA: Diagnosis not present

## 2022-10-19 NOTE — ED Notes (Signed)
Patient is sleeping. Respirations equal and unlabored, skin warm and dry. No change in assessment or acuity. Routine safety checks conducted according to facility protocol. Will continue to monitor for safety.   

## 2022-10-19 NOTE — Group Note (Signed)
Group Topic: Relapse and Recovery  Group Date: 10/19/2022 Start Time: 1030 End Time: 1100 Facilitators: Jenean Lindau, RN  Department: [facility]  Number of Participants: 4  Group Focus: chemical dependency education Treatment Modality:  Solution-Focused Therapy Interventions utilized were exploration and problem solving Purpose: enhance coping skills, explore maladaptive thinking, express feelings, and increase insight  Name: Philip Collins Date of Birth: 10-May-1966  MR: [id number]    Level of Participation: active Quality of Participation: engaged Interactions with others: gave feedback Mood/Affect: appropriate Triggers (if applicable): n/a Cognition: coherent/clear and insightful Progress: Gaining insight Response:  positive Plan: follow-up needed  Patients Problems:  Patient Active Problem List   Diagnosis Date Noted   Cocaine use disorder 10/12/2022   Hemorrhoids 09/20/2022   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

## 2022-10-19 NOTE — ED Provider Notes (Cosign Needed Addendum)
FBC/OBS ASAP Discharge Summary  Date and Time: 10/19/2022 11:09 AM  Name: Philip Collins  MRN:  [id number]   Discharge Diagnoses:  Final diagnoses:  Alcohol use disorder  Cocaine use disorder  Substance induced mood disorder    Subjective: Philip Collins is a 57 year old male with a psychiatric history of alcohol abuse and cocaine abuse presented voluntarily to [facility]. Patient relapsed on crack cocaine and alcohol after being sober for 1 year and is experiencing worsening depression and anxiety symptoms.   Stay Summary: During stay patient did not require any psychotropic medications. He did very well on the unit with no behavior episodes and was engaged in group sessions. His relapse that led to admission was only 1 day long, but he endorsed a lot of guilt given his length of sobriety. Throughout stay he was motivated to find sober living housing and restart the sobriety process. He has an overall positive mindset and on day of dc he reports feeling ready to leave detox and take on the next steps in the process. He has family members for support and endorses gained insight on what led to his relapse including feeling lonely in [place]. He endorses being more aware of the support he does have nearby and recognizes the positives of living in [place] away from his kids, despite having very good relationships with them. He see's [place] as a good place for him to maintain sobriety. He does not feel "depressed", but endorses recognizing he can feel "down" on occasion. He is already creating plans to address the loneliness feelings.   He has denied SI, HI, and AVH at least the last 48h of his stay.   Total Time spent with patient: 20 minutes  Past Psychiatric History: [facility] Past Medical History: Denies any significant medical issues Family History: Father-alcoholism, Brother-alcoholism Social History: 75 y/o divorced, relapsed after being 1-year sober, works at the [employer]  Tobacco Cessation:  A prescription for an FDA-approved tobacco cessation medication was offered at discharge and the patient refused  Current Medications:  Current Facility-Administered Medications  Medication Dose Route Frequency Provider Last Rate Last Admin   acetaminophen (TYLENOL) tablet 650 mg  650 mg Oral Q6H PRN Bobbitt, Shalon E, NP       alum & mag hydroxide-simeth (MAALOX/MYLANTA) 200-200-20 MG/5ML suspension 30 mL  30 mL Oral Q4H PRN Bobbitt, Shalon E, NP       magnesium hydroxide (MILK OF MAGNESIA) suspension 30 mL  30 mL Oral Daily PRN Bobbitt, Shalon E, NP       multivitamin with minerals tablet 1 tablet  1 tablet Oral Daily Karsten Ro, MD   1 tablet at 10/19/22 0912   nicotine polacrilex (NICORETTE) gum 2 mg  2 mg Oral PRN Lamar Sprinkles, MD       thiamine (VITAMIN B1) tablet 100 mg  100 mg Oral Daily Doda, Vandana, MD   100 mg at 10/19/22 0912   traZODone (DESYREL) tablet 50 mg  50 mg Oral QHS PRN Bobbitt, Shalon E, NP       No current outpatient medications on file.    PTA Medications:  Facility Ordered Medications  Medication   acetaminophen (TYLENOL) tablet 650 mg   alum & mag hydroxide-simeth (MAALOX/MYLANTA) 200-200-20 MG/5ML suspension 30 mL   magnesium hydroxide (MILK OF MAGNESIA) suspension 30 mL   [EXPIRED] hydrOXYzine (ATARAX) tablet 25 mg   [EXPIRED] loperamide (IMODIUM) capsule 2-4 mg   [EXPIRED] ondansetron (ZOFRAN-ODT) disintegrating tablet 4 mg  traZODone (DESYREL) tablet 50 mg   [COMPLETED] thiamine (VITAMIN B1) injection 100 mg   thiamine (VITAMIN B1) tablet 100 mg   multivitamin with minerals tablet 1 tablet   [EXPIRED] LORazepam (ATIVAN) tablet 1 mg   nicotine polacrilex (NICORETTE) gum 2 mg       10/19/2022   11:07 AM 10/15/2022   12:15 PM 10/12/2022    9:59 PM  Depression screen PHQ 2/9  Decreased Interest 1 0 1  Down, Depressed, Hopeless 1 1 1   PHQ - 2 Score 2 1 2   Altered sleeping 3  1  Tired, decreased energy 1  0  Change  in appetite 0  0  Feeling bad or failure about yourself  1  1  Trouble concentrating 0  1  Moving slowly or fidgety/restless 0  0  Suicidal thoughts 0  0  PHQ-9 Score 7  5  Difficult doing work/chores Somewhat difficult  Somewhat difficult    Flowsheet Row ED from 10/12/2022 in [facility] Admission (Discharged) from 03/20/2022 in [facility] from 10/04/2021 in [facility] Emergency Department at [facility]  C-SSRS RISK CATEGORY No Risk No Risk No Risk       Musculoskeletal  Strength & Muscle Tone: within normal limits Gait & Station: normal Patient leans: N/A  Psychiatric Specialty Exam  Presentation  General Appearance:  Appropriate for Environment; Casual  Eye Contact: Good  Speech: Clear and Coherent  Speech Volume: Normal  Handedness: Right   Mood and Affect  Mood: Euthymic  Affect: Appropriate   Thought Process  Thought Processes: Coherent  Descriptions of Associations:Intact  Orientation:Full (Time, Place and Person)  Thought Content:Logical  Diagnosis of Schizophrenia or Schizoaffective disorder in past: No    Hallucinations:Hallucinations: None  Ideas of Reference:None  Suicidal Thoughts:Suicidal Thoughts: No  Homicidal Thoughts:Homicidal Thoughts: No   Sensorium  Memory: Immediate Good; Recent Good  Judgment: Good  Insight: Good   Executive Functions  Concentration: Good  Attention Span: Good  Recall: Good  Fund of Knowledge: Good  Language: Good   Psychomotor Activity  Psychomotor Activity: Psychomotor Activity: Normal   Assets  Assets: Communication Skills; Desire for Improvement; Resilience; Social Support   Sleep  Sleep: Sleep: Fair   No data recorded  Physical Exam  Physical Exam HENT:     Head: Normocephalic and atraumatic.  Pulmonary:     Effort: Pulmonary effort is normal.  Neurological:     Mental Status: He is alert  and oriented to person, place, and time.    Review of Systems  Psychiatric/Behavioral:  Negative for hallucinations and suicidal ideas. The patient is not nervous/anxious.    Blood pressure 116/81, pulse (!) 56, temperature 97.9 F (36.6 C), resp. rate 18, SpO2 100 %. There is no height or weight on file to calculate BMI.  Demographic Factors:  Male, Low socioeconomic status, and Living alone  Loss Factors: NA  Historical Factors: Family history of mental illness or substance abuse  Risk Reduction Factors:   Sense of responsibility to family and Positive social support  Continued Clinical Symptoms:  NA  Cognitive Features That Contribute To Risk:  None    Suicide Risk:  Minimal: No identifiable suicidal ideation.  Patients presenting with no risk factors but with morbid ruminations; may be classified as minimal risk based on the severity of the depressive symptoms  Plan Of Care/Follow-up recommendations:  Follow up recommendations: - Activity as tolerated. - Diet as recommended by PCP. - Keep  all scheduled follow-up appointments as recommended.   Disposition: Home w/ sister. He has been in contact with upper management of [facility] and thinks he may be accepted somewhere or [facility]. At worse he will get a hotel room. His sisters is supportive but he does not want to live with his brother-in-law. He also speaks with his children frequently and endorses their support. He intends to find a new job within the next week as well.   - NO medications rx  PGY-3 Bobbye Morton, MD 10/19/2022, 11:09 AM

## 2022-10-19 NOTE — Progress Notes (Signed)
Pt is awake, alert and oriented X4. No distress noted or concerns voiced. Presently watching TV in the dayroom. Pt denies pain and current SI/HI/AVH, plan or intent. Staff will monitor for pt's safety.

## 2023-01-17 ENCOUNTER — Ambulatory Visit: Payer: Self-pay | Admitting: Nurse Practitioner

## 2023-02-21 ENCOUNTER — Other Ambulatory Visit: Payer: Self-pay

## 2023-10-04 ENCOUNTER — Ambulatory Visit (HOSPITAL_COMMUNITY)
Admission: EM | Admit: 2023-10-04 | Discharge: 2023-10-04 | Disposition: A | Discharge status: Home / Self Care | Payer: MEDICAID

## 2023-10-04 DIAGNOSIS — F1994 Other psychoactive substance use, unspecified with psychoactive substance-induced mood disorder: Secondary | ICD-10-CM

## 2023-10-04 DIAGNOSIS — F32A Depression, unspecified: Secondary | ICD-10-CM | POA: Insufficient documentation

## 2023-10-04 DIAGNOSIS — F1414 Cocaine abuse with cocaine-induced mood disorder: Secondary | ICD-10-CM | POA: Insufficient documentation

## 2023-10-04 NOTE — ED Provider Notes (Signed)
 Behavioral Health Urgent Care Medical Screening Exam  Patient Name: Philip Collins MRN: [id number] Date of Evaluation: 10/04/23 Chief Complaint:  feeling down Diagnosis:  Final diagnoses:  Substance induced mood disorder (HCC)    History of Present illness: Philip Collins is a 58 y.o. male with a psych history of substance induced mood disorder, cocaine use disorder and alcohol use disorder presented to [facility] voluntarily and unaccompanied. Patient reports that he has been using crack cocaine.   Patient reports that he has been living at [facility]. Patient states that he has been feeling down due to financial stress, argument with his daughter on the phone. Patient reports that he receives peer support services and that he attends AA several times a week. Patient reports that he is estranged from most of his family and that he is considered the black sheep of the family. Patient reports that he started using crack cocaine after his marriage ended. Patient states that he is working 2 jobs but feel he cannot get ahead. Nurse Practitioner asked patient what bought him to the facility patient stated, that he needed someone to talk to and needed some sleep. Patient stated that he was not interested in taking any medications because he prefers natural remedies.     Patient denies any SI/HI or AVH. Patient will be given   Flowsheet Row ED from 10/04/2023 in [facility] ED from 10/12/2022 in [facility] Admission (Discharged) from 03/20/2022 in [facility]  C-SSRS RISK CATEGORY No Risk No Risk No Risk       Psychiatric Specialty Exam  Presentation  General Appearance:Casual  Eye Contact:Good  Speech:Clear and Coherent  Speech Volume:Normal  Handedness:Right   Mood and Affect  Mood: Depressed  Affect: Appropriate   Thought Process  Thought Processes: Coherent  Descriptions of  Associations:Intact  Orientation:Full (Time, Place and Person)  Thought Content:WDL  Diagnosis of Schizophrenia or Schizoaffective disorder in past: No   Hallucinations:None  Ideas of Reference:None  Suicidal Thoughts:No  Homicidal Thoughts:No   Sensorium  Memory: Immediate Good; Recent Good; Remote Good  Judgment: Good  Insight: Good   Executive Functions  Concentration: Good  Attention Span: Good  Recall: Good  Fund of Knowledge: Good  Language: Good   Psychomotor Activity  Psychomotor Activity: Normal   Assets  Assets: Communication Skills; Housing; Physical Health; Resilience   Sleep  Sleep: Fair  Number of hours:  6   Physical Exam: Physical Exam HENT:     Head: Normocephalic.     Nose: Nose normal.  Eyes:     Pupils: Pupils are equal, round, and reactive to light.  Cardiovascular:     Rate and Rhythm: Normal rate.  Pulmonary:     Effort: Pulmonary effort is normal.  Abdominal:     General: Abdomen is flat.  Musculoskeletal:        General: Normal range of motion.     Cervical back: Normal range of motion.  Skin:    General: Skin is warm.  Neurological:     Mental Status: He is alert and oriented to person, place, and time.  Psychiatric:        Attention and Perception: Attention normal.        Mood and Affect: Mood is depressed.        Speech: Speech normal.        Behavior: Behavior is cooperative.        Thought Content: Thought content normal.  Thought content does not include suicidal ideation. Thought content does not include suicidal plan.        Cognition and Memory: Cognition normal.        Judgment: Judgment is impulsive.    Review of Systems  Constitutional: Negative.   HENT: Negative.    Eyes: Negative.   Respiratory: Negative.    Cardiovascular: Negative.   Gastrointestinal: Negative.   Genitourinary: Negative.   Musculoskeletal: Negative.   Skin: Negative.   Neurological: Negative.    Endo/Heme/Allergies: Negative.   Psychiatric/Behavioral:  Positive for depression.    Blood pressure 131/87, pulse 67, temperature 98.2 F (36.8 C), temperature source Oral, resp. rate 20, SpO2 100%. There is no height or weight on file to calculate BMI.  Musculoskeletal: Strength & Muscle Tone: within normal limits Gait & Station: normal Patient leans: N/A   [facility] MSE Discharge Disposition for Follow up and Recommendations: Based on my evaluation the patient does not appear to have an emergency medical condition and can be discharged with resources and follow up care in outpatient services for Medication Management and Individual Therapy   Jasper Riling, NP 10/04/2023, 10:31 PM
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
## 2023-10-04 NOTE — Discharge Instructions (Addendum)
  Discharge recommendations:  Patient is to take medications as prescribed. Please see information for follow-up appointment with psychiatry and therapy. Please follow up with your primary care provider for all medical related needs.   Therapy: We recommend that patient participate in individual therapy to address mental health concerns.  Medications: The patient or guardian is to contact a medical professional and/or outpatient provider to address any new side effects that develop. The patient or guardian should update outpatient providers of any new medications and/or medication changes.    Safety:  The patient should abstain from use of illicit substances/drugs and abuse of any medications. If symptoms worsen or do not continue to improve or if the patient becomes actively suicidal or homicidal then it is recommended that the patient return to the closest hospital emergency department, the [facility], or call 911 for further evaluation and treatment. National Suicide Prevention Lifeline 1-800-SUICIDE or 7080468570.  About 988 988 offers 24/7 access to trained crisis counselors who can help people experiencing mental health-related distress. People can call or text 988 or chat 988lifeline.org for themselves or if they are worried about a loved one who may need crisis support.  Crisis Mobile: [facility]:                     585-684-0566 (for crisis response 24 hours a day) [facility] Hotline:                                            364-820-3756   [facility] Outpatient Office [address], [place], [place] [address], [place]

## 2023-10-05 ENCOUNTER — Other Ambulatory Visit: Payer: Self-pay

## 2023-10-05 ENCOUNTER — Encounter (HOSPITAL_COMMUNITY): Payer: Self-pay | Admitting: Psychiatry

## 2023-10-05 ENCOUNTER — Ambulatory Visit (HOSPITAL_COMMUNITY)
Admission: EM | Admit: 2023-10-05 | Discharge: 2023-10-05 | Disposition: A | Discharge status: Other Acute Inpatient Hospital | Payer: MEDICAID | Attending: Psychiatry | Admitting: Psychiatry

## 2023-10-05 ENCOUNTER — Inpatient Hospital Stay (HOSPITAL_COMMUNITY)
Admission: AD | Admit: 2023-10-05 | Discharge: 2023-10-10 | DRG: 885 | Disposition: A | Discharge status: Home / Self Care | Payer: MEDICAID | Source: Intra-hospital | Attending: Psychiatry | Admitting: Psychiatry

## 2023-10-05 DIAGNOSIS — Z91411 Personal history of adult psychological abuse: Secondary | ICD-10-CM

## 2023-10-05 DIAGNOSIS — Z5901 Sheltered homelessness: Secondary | ICD-10-CM | POA: Diagnosis not present

## 2023-10-05 DIAGNOSIS — R4589 Other symptoms and signs involving emotional state: Secondary | ICD-10-CM | POA: Diagnosis present

## 2023-10-05 DIAGNOSIS — Z8249 Family history of ischemic heart disease and other diseases of the circulatory system: Secondary | ICD-10-CM | POA: Diagnosis not present

## 2023-10-05 DIAGNOSIS — R41843 Psychomotor deficit: Secondary | ICD-10-CM | POA: Diagnosis present

## 2023-10-05 DIAGNOSIS — E559 Vitamin D deficiency, unspecified: Secondary | ICD-10-CM | POA: Diagnosis present

## 2023-10-05 DIAGNOSIS — F332 Major depressive disorder, recurrent severe without psychotic features: Secondary | ICD-10-CM | POA: Diagnosis present

## 2023-10-05 DIAGNOSIS — R7303 Prediabetes: Secondary | ICD-10-CM | POA: Diagnosis present

## 2023-10-05 DIAGNOSIS — G4700 Insomnia, unspecified: Secondary | ICD-10-CM | POA: Diagnosis present

## 2023-10-05 DIAGNOSIS — Z5941 Food insecurity: Secondary | ICD-10-CM

## 2023-10-05 DIAGNOSIS — R54 Age-related physical debility: Secondary | ICD-10-CM | POA: Diagnosis present

## 2023-10-05 DIAGNOSIS — N401 Enlarged prostate with lower urinary tract symptoms: Secondary | ICD-10-CM | POA: Diagnosis present

## 2023-10-05 DIAGNOSIS — Z841 Family history of disorders of kidney and ureter: Secondary | ICD-10-CM | POA: Diagnosis not present

## 2023-10-05 DIAGNOSIS — Z820 Family history of epilepsy and other diseases of the nervous system: Secondary | ICD-10-CM | POA: Diagnosis not present

## 2023-10-05 DIAGNOSIS — R45851 Suicidal ideations: Secondary | ICD-10-CM | POA: Diagnosis present

## 2023-10-05 DIAGNOSIS — F1210 Cannabis abuse, uncomplicated: Secondary | ICD-10-CM | POA: Diagnosis present

## 2023-10-05 DIAGNOSIS — Z9889 Other specified postprocedural states: Secondary | ICD-10-CM | POA: Diagnosis not present

## 2023-10-05 DIAGNOSIS — N138 Other obstructive and reflux uropathy: Secondary | ICD-10-CM | POA: Diagnosis present

## 2023-10-05 DIAGNOSIS — F17210 Nicotine dependence, cigarettes, uncomplicated: Secondary | ICD-10-CM | POA: Diagnosis present

## 2023-10-05 DIAGNOSIS — F1410 Cocaine abuse, uncomplicated: Secondary | ICD-10-CM | POA: Diagnosis present

## 2023-10-05 DIAGNOSIS — Z5982 Transportation insecurity: Secondary | ICD-10-CM | POA: Diagnosis not present

## 2023-10-05 DIAGNOSIS — F419 Anxiety disorder, unspecified: Secondary | ICD-10-CM | POA: Diagnosis present

## 2023-10-05 DIAGNOSIS — F17200 Nicotine dependence, unspecified, uncomplicated: Secondary | ICD-10-CM | POA: Diagnosis present

## 2023-10-05 DIAGNOSIS — Z5948 Other specified lack of adequate food: Secondary | ICD-10-CM

## 2023-10-05 DIAGNOSIS — Z5989 Other problems related to housing and economic circumstances: Secondary | ICD-10-CM

## 2023-10-05 DIAGNOSIS — Z833 Family history of diabetes mellitus: Secondary | ICD-10-CM

## 2023-10-05 DIAGNOSIS — F1994 Other psychoactive substance use, unspecified with psychoactive substance-induced mood disorder: Secondary | ICD-10-CM

## 2023-10-05 LAB — COMPREHENSIVE METABOLIC PANEL WITH GFR
ALT: 21 U/L (ref 0–44)
AST: 19 U/L (ref 15–41)
Albumin: 3.4 g/dL — ABNORMAL LOW (ref 3.5–5.0)
Alkaline Phosphatase: 50 U/L (ref 38–126)
Anion gap: 6 (ref 5–15)
BUN: 12 mg/dL (ref 6–20)
CO2: 28 mmol/L (ref 22–32)
Calcium: 9.2 mg/dL (ref 8.9–10.3)
Chloride: 105 mmol/L (ref 98–111)
Creatinine, Ser: 0.94 mg/dL (ref 0.61–1.24)
GFR, Estimated: >60 mL/min (ref >60)
Glucose, Bld: 88 mg/dL (ref 70–99)
Potassium: 4 mmol/L (ref 3.5–5.1)
Sodium: 139 mmol/L (ref 135–145)
Total Bilirubin: 1 mg/dL (ref 0.0–1.2)
Total Protein: 6.2 g/dL — ABNORMAL LOW (ref 6.5–8.1)

## 2023-10-05 LAB — POCT URINE DRUG SCREEN - MANUAL ENTRY (I-SCREEN)
POC Amphetamine UR: NOT DETECTED
POC Buprenorphine (BUP): NOT DETECTED
POC Cocaine UR: POSITIVE — AB
POC Marijuana UR: POSITIVE — AB
POC Methadone UR: NOT DETECTED
POC Methamphetamine UR: NOT DETECTED
POC Morphine: NOT DETECTED
POC Oxazepam (BZO): NOT DETECTED
POC Oxycodone UR: NOT DETECTED
POC Secobarbital (BAR): NOT DETECTED

## 2023-10-05 LAB — CBC WITH DIFFERENTIAL/PLATELET
Abs Immature Granulocytes: 0 10*3/uL (ref 0.00–0.07)
Basophils Absolute: 0 10*3/uL (ref 0.0–0.1)
Basophils Relative: 1 %
Eosinophils Absolute: 0.1 10*3/uL (ref 0.0–0.5)
Eosinophils Relative: 1 %
HCT: 40.8 % (ref 39.0–52.0)
Hemoglobin: 13.9 g/dL (ref 13.0–17.0)
Immature Granulocytes: 0 %
Lymphocytes Relative: 44 %
Lymphs Abs: 2.6 10*3/uL (ref 0.7–4.0)
MCH: 29.6 pg (ref 26.0–34.0)
MCHC: 34.1 g/dL (ref 30.0–36.0)
MCV: 86.8 fL (ref 80.0–100.0)
Monocytes Absolute: 0.6 10*3/uL (ref 0.1–1.0)
Monocytes Relative: 10 %
Neutro Abs: 2.7 10*3/uL (ref 1.7–7.7)
Neutrophils Relative %: 44 %
Platelets: 206 10*3/uL (ref 150–400)
RBC: 4.7 MIL/uL (ref 4.22–5.81)
RDW: 12.4 % (ref 11.5–15.5)
WBC: 6 10*3/uL (ref 4.0–10.5)
nRBC: 0 % (ref 0.0–0.2)

## 2023-10-05 LAB — ETHANOL: Alcohol, Ethyl (B): <10 mg/dL (ref <10)

## 2023-10-05 MED ORDER — HYDROXYZINE HCL 25 MG PO TABS: 25.0000 mg | ORAL_TABLET | Freq: Three times a day (TID) | ORAL | Status: DC | PRN

## 2023-10-05 MED ORDER — HALOPERIDOL LACTATE 5 MG/ML IJ SOLN: 5.0000 mg | Freq: Three times a day (TID) | INTRAMUSCULAR | Status: DC | PRN

## 2023-10-05 MED ORDER — MAGNESIUM HYDROXIDE 400 MG/5ML PO SUSP: 30.0000 mL | Freq: Every day | ORAL | Status: DC | PRN

## 2023-10-05 MED ORDER — LORAZEPAM 2 MG/ML IJ SOLN: 2.0000 mg | Freq: Three times a day (TID) | INTRAMUSCULAR | Status: DC | PRN

## 2023-10-05 MED ORDER — HALOPERIDOL 5 MG PO TABS: 5.0000 mg | ORAL_TABLET | Freq: Three times a day (TID) | ORAL | Status: DC | PRN

## 2023-10-05 MED ORDER — SERTRALINE HCL 50 MG PO TABS
50.0000 mg | ORAL_TABLET | Freq: Every day | ORAL | Status: DC
  Administered 2023-10-06: 50 mg via ORAL
  Filled 2023-10-05 (×3): qty 1

## 2023-10-05 MED ORDER — SERTRALINE HCL 50 MG PO TABS
50.0000 mg | ORAL_TABLET | Freq: Every day | ORAL | Status: DC
  Administered 2023-10-05: 50 mg via ORAL
  Filled 2023-10-05: qty 1

## 2023-10-05 MED ORDER — DIPHENHYDRAMINE HCL 50 MG/ML IJ SOLN: 50.0000 mg | Freq: Three times a day (TID) | INTRAMUSCULAR | Status: DC | PRN

## 2023-10-05 MED ORDER — ACETAMINOPHEN 325 MG PO TABS
650.0000 mg | ORAL_TABLET | Freq: Four times a day (QID) | ORAL | Status: DC | PRN
  Administered 2023-10-07: 650 mg via ORAL
  Filled 2023-10-05: qty 2

## 2023-10-05 MED ORDER — DIPHENHYDRAMINE HCL 50 MG PO CAPS: 50.0000 mg | ORAL_CAPSULE | Freq: Three times a day (TID) | ORAL | Status: DC | PRN

## 2023-10-05 MED ORDER — ACETAMINOPHEN 325 MG PO TABS: 650.0000 mg | ORAL_TABLET | Freq: Four times a day (QID) | ORAL | Status: DC | PRN

## 2023-10-05 MED ORDER — HYDROXYZINE HCL 25 MG PO TABS
25.0000 mg | ORAL_TABLET | Freq: Three times a day (TID) | ORAL | Status: DC | PRN
  Administered 2023-10-06 – 2023-10-09 (×4): 25 mg via ORAL
  Filled 2023-10-05 (×4): qty 1

## 2023-10-05 MED ORDER — HALOPERIDOL LACTATE 5 MG/ML IJ SOLN: 10.0000 mg | Freq: Three times a day (TID) | INTRAMUSCULAR | Status: DC | PRN

## 2023-10-05 MED ORDER — ALUM & MAG HYDROXIDE-SIMETH 200-200-20 MG/5ML PO SUSP: 30.0000 mL | ORAL | Status: DC | PRN

## 2023-10-05 MED ORDER — DIPHENHYDRAMINE HCL 25 MG PO CAPS: 50.0000 mg | ORAL_CAPSULE | Freq: Three times a day (TID) | ORAL | Status: DC | PRN

## 2023-10-05 MED ORDER — TRAZODONE HCL 50 MG PO TABS: 50.0000 mg | ORAL_TABLET | Freq: Every evening | ORAL | Status: DC | PRN

## 2023-10-05 NOTE — ED Notes (Signed)
 Patient is transferring to [facility] at this time via safe transport. EMTALA, and other transfer paperwork sent with patient and provided to transport. Patient A&OX4. Denies SI,HI, and A/V/H. Calm and cooperative at time of transport. All valuables/belongings sent with patient. Patient in no current distress.

## 2023-10-05 NOTE — ED Notes (Signed)
 Patient currently asleep. Breathing even and unlabored with even rise and fall of chest. No s/s of current distress.

## 2023-10-05 NOTE — ED Notes (Signed)
 Patient resting quietly in bed with eyes closed. Respirations equal and unlabored, skin warm and dry, NAD. Routine safety checks conducted according to facility protocol. Will continue to monitor for safety.

## 2023-10-05 NOTE — Progress Notes (Signed)
 Admission note: Patient is a 58 year old AA, voluntarily  admitted from [facility] for complaint of worsening depression with substance use.  According to the report received from [place], [place], patient was initially discharged at [facility] but patient refused to leave, crying he had nowhere to go. Patient arrived to the [facility] unit at 1800 via safe transport, awake, alert, and oriented X's 4. Patient presented with flat affect and a depressed mood noted, he denies SI/HI/AVH, but endorses anxiety. Patient states he's homeless, and works 2 jobs. When patient was asked what brought him to the hospital, patient started to cry, and states, "I had a mental break down, I'm jist tired of life,as much as I tried, nothing seems to go my way." Patient further states, "I work two jobs, I have strained relationship with my sibling, and kids, I'm just tired about everything."  Pt has orientation to unit, room and routine. Information packet given to patient and safety information discussed with him.  Admission INP armband ID verified with patient, and in place. SR, fall risk assessment completed with Patient and he verbalized understanding of risks associated with falls. No contraband found during skin assessment, Skin, clean-dry- intact without evidence of bruising, or skin tears and tracks marks. Q 15 minutes safety observation in place. Staff will continue to provide support to patient.

## 2023-10-05 NOTE — Progress Notes (Signed)
 [facility] LCSW Progress Note   10/05/2023    2:13 PM  Philip Collins   [id number]   Type of Contact and Topic:  Psychiatric Bed Placement   Pt accepted to [facility] 407-2    Patient meets inpatient criteria per De Burrs, NP    The attending provider will be Dr. Sherron Flemings   Call report to [phone]    Fortunato Curling, LPN @ [facility] notified.     Pt scheduled  to arrive at [facility] TODAY.    Damita Dunnings, MSW, LCSW-A  2:15 PM 10/05/2023
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
## 2023-10-05 NOTE — ED Provider Notes (Signed)
 [facility] Continuous Assessment Admission H&P  Date: 10/05/23 Patient Name: Philip Collins MRN: [id number] Chief Complaint: substance abuse     Diagnoses:  Final diagnoses:  Substance induced mood disorder [facility])    HPI: Philip Collins is a 58 y/o male presented to [facility] as a walk in unaccompanied with complaints of worsening substance abuse and depression.  Philip Collins, 58 y.o., male patient seen face to face by this provider and chart reviewed on 10/05/23.   Philip Collins presented earlier to [facility] for crack cocaine use and was subsequently discharged from [facility] with community resources. Patient denied any SI/Hi or AVH. After being discharged patient remained in the lobby and became very tearful stating he did not have any place to go and that he would use crack cocaine if he was made to leave. Patient stated that he has been kicked out of [address] because of substance use. Patient is stating that he does not know what he may do to himself if he is discharged. Patient has some passive suicidal ideation with no plan or intent.   During evaluation Philip Collins is sitting in no acute distress. He is alert, oriented x 4, calm but very tearful. His mood is depressed with congruent affect. He/She has normal speech, and behavior.  Objectively there is no evidence of psychosis/mania or delusional thinking.  Patient is able to converse coherently, goal directed thoughts, no distractibility, or pre-occupation.  He also denies suicidal/self-harm/homicidal ideation, psychosis, and paranoia.  Patient answered question appropriately.     Patient will be admitted to [facility] continuous observation for crisis management, safety and stabilization.   Total Time spent with patient: 20 minutes  Musculoskeletal  Strength & Muscle Tone: within normal limits Gait & Station: normal Patient leans: N/A  Psychiatric Specialty Exam  Presentation General Appearance:  Casual  Eye  Contact: Good  Speech: Clear and Coherent  Speech Volume: Normal  Handedness: Right   Mood and Affect  Mood: Depressed  Affect: Appropriate   Thought Process  Thought Processes: Coherent  Descriptions of Associations:Intact  Orientation:Full (Time, Place and Person)  Thought Content:WDL  Diagnosis of Schizophrenia or Schizoaffective disorder in past: No   Hallucinations:Hallucinations: None  Ideas of Reference:None  Suicidal Thoughts:Suicidal Thoughts: No  Homicidal Thoughts:Homicidal Thoughts: No   Sensorium  Memory: Immediate Good; Recent Good; Remote Good  Judgment: Fair  Insight: Fair   Art therapist  Concentration: Fair  Attention Span: Fair  Recall: Good  Fund of Knowledge: Good  Language: Good   Psychomotor Activity  Psychomotor Activity: Psychomotor Activity: Normal   Assets  Assets: Communication Skills; Physical Health; Resilience; Desire for Improvement   Sleep  Sleep: Sleep: Fair Number of Hours of Sleep: 6   Nutritional Assessment (For OBS and FBC admissions only) Has the patient had a weight loss or gain of 10 pounds or more in the last 3 months?: No Has the patient had a decrease in food intake/or appetite?: No Does the patient have dental problems?: No Does the patient have eating habits or behaviors that may be indicators of an eating disorder including binging or inducing vomiting?: No Has the patient recently lost weight without trying?: 0 Has the patient been eating poorly because of a decreased appetite?: 0 Malnutrition Screening Tool Score: 0    Physical Exam HENT:     Head: Normocephalic.     Nose: Nose normal.  Eyes:     Pupils: Pupils are equal, round, and reactive to light.  Cardiovascular:     Rate and Rhythm: Normal rate.  Pulmonary:     Effort: Pulmonary effort is normal.  Abdominal:     General: Abdomen is flat.  Musculoskeletal:        General: Normal range of motion.      Cervical back: Normal range of motion.  Skin:    General: Skin is warm.  Neurological:     Mental Status: He is alert and oriented to person, place, and time.  Psychiatric:        Attention and Perception: Attention normal.        Mood and Affect: Mood is depressed. Affect is tearful.        Speech: Speech normal.        Behavior: Behavior is agitated.        Cognition and Memory: Cognition normal.        Judgment: Judgment is impulsive.   Review of Systems  Constitutional: Negative.   HENT: Negative.    Eyes: Negative.   Respiratory: Negative.    Cardiovascular: Negative.   Gastrointestinal: Negative.   Genitourinary: Negative.   Musculoskeletal: Negative.   Skin: Negative.     Blood pressure (!) 140/90, pulse 64, temperature 97.8 F (36.6 C), temperature source Oral, resp. rate 20, SpO2 100%. There is no height or weight on file to calculate BMI.  Past Psychiatric History: [facility] 10/12/22-10/19/22  Is the patient at risk to self? Yes  Has the patient been a risk to self in the past 6 months? No .    Has the patient been a risk to self within the distant past? No   Is the patient a risk to others? No   Has the patient been a risk to others in the past 6 months? No   Has the patient been a risk to others within the distant past? No   Past Medical History:  Past Medical History:  Diagnosis Date   BPH with obstruction/lower urinary tract symptoms    Bradycardia 01/15/2022   Broken tooth 01/15/2022   Erectile dysfunction    Incomplete bladder emptying 01/15/2022   Lump of left breast 02/13/2022   Prediabetes 02/13/2022   Substance abuse (HCC)    drug     Family History:  Family History  Problem Relation Age of Onset   Hypertension Mother    Diabetes Mother    Kidney failure Mother    Other Father        "gum disease"   Hypertension Sister    Hypertension Sister    Other Maternal Grandmother        unknown medical history   Dementia Maternal Grandfather     Other Paternal Grandmother        unknown medical history   Other Paternal Grandfather        unknown medical history   Prostate cancer Neg Hx    Bladder Cancer Neg Hx    Kidney cancer Neg Hx    Breast cancer Neg Hx      Social History: 58 y/o male, crack cocaine abuse, homeless, works at [employer] and a [employer]  Last Labs:  No visits with results within 6 Month(s) from this visit.  Latest known visit with results is:  Admission on 10/12/2022, Discharged on 10/19/2022  Component Date Value Ref Range Status   WBC 10/12/2022 6.3  4.0 - 10.5 K/uL Final   RBC 10/12/2022 5.26  4.22 - 5.81 MIL/uL Final   Hemoglobin 10/12/2022 16.4  13.0 -  17.0 g/dL Final   HCT 16/04/9603 46.4  39.0 - 52.0 % Final   MCV 10/12/2022 88.2  80.0 - 100.0 fL Final   MCH 10/12/2022 31.2  26.0 - 34.0 pg Final   MCHC 10/12/2022 35.3  30.0 - 36.0 g/dL Final   RDW 54/03/8118 12.2  11.5 - 15.5 % Final   Platelets 10/12/2022 209  150 - 400 K/uL Final   nRBC 10/12/2022 0.0  0.0 - 0.2 % Final   Neutrophils Relative % 10/12/2022 52  % Final   Neutro Abs 10/12/2022 3.3  1.7 - 7.7 K/uL Final   Lymphocytes Relative 10/12/2022 38  % Final   Lymphs Abs 10/12/2022 2.4  0.7 - 4.0 K/uL Final   Monocytes Relative 10/12/2022 8  % Final   Monocytes Absolute 10/12/2022 0.5  0.1 - 1.0 K/uL Final   Eosinophils Relative 10/12/2022 1  % Final   Eosinophils Absolute 10/12/2022 0.1  0.0 - 0.5 K/uL Final   Basophils Relative 10/12/2022 1  % Final   Basophils Absolute 10/12/2022 0.0  0.0 - 0.1 K/uL Final   Immature Granulocytes 10/12/2022 0  % Final   Abs Immature Granulocytes 10/12/2022 0.00  0.00 - 0.07 K/uL Final   Performed at [facility], [address] [place], [place] [address]   Sodium 10/12/2022 140  135 - 145 mmol/L Final   Potassium 10/12/2022 3.7  3.5 - 5.1 mmol/L Final   Chloride 10/12/2022 100  98 - 111 mmol/L Final   CO2 10/12/2022 26  22 - 32 mmol/L Final   Glucose, Bld 10/12/2022 90  70 - 99 mg/dL  Final   Glucose reference range applies only to samples taken after fasting for at least 8 hours.   BUN 10/12/2022 15  6 - 20 mg/dL Final   Creatinine, Ser 10/12/2022 0.95  0.61 - 1.24 mg/dL Final   Calcium 95/62/1308 10.1  8.9 - 10.3 mg/dL Final   Total Protein 65/78/4696 8.1  6.5 - 8.1 g/dL Final   Albumin 29/52/8413 4.6  3.5 - 5.0 g/dL Final   AST 24/40/1027 39  15 - 41 U/L Final   ALT 10/12/2022 23  0 - 44 U/L Final   Alkaline Phosphatase 10/12/2022 81  38 - 126 U/L Final   Total Bilirubin 10/12/2022 1.8 (H)  0.3 - 1.2 mg/dL Final   GFR, Estimated 10/12/2022 >60  >60 mL/min Final   Comment: (NOTE) Calculated using the CKD-EPI Creatinine Equation (2021)    Anion gap 10/12/2022 14  5 - 15 Final   Performed at [facility], [address], [place], [place] [address]   Alcohol, Ethyl (B) 10/12/2022 <10  <10 mg/dL Final   Comment: (NOTE) Lowest detectable limit for serum alcohol is 10 mg/dL.  For medical purposes only. Performed at [facility] Lab, [address], [place], [place] [address]    POC Amphetamine UR 10/13/2022 None Detected  NONE DETECTED (Cut Off Level 1000 ng/mL) Final   POC Secobarbital (BAR) 10/13/2022 None Detected  NONE DETECTED (Cut Off Level 300 ng/mL) Final   POC Buprenorphine (BUP) 10/13/2022 None Detected  NONE DETECTED (Cut Off Level 10 ng/mL) Final   POC Oxazepam (BZO) 10/13/2022 None Detected  NONE DETECTED (Cut Off Level 300 ng/mL) Final   POC Cocaine UR 10/13/2022 Positive (A)  NONE DETECTED (Cut Off Level 300 ng/mL) Final   POC Methamphetamine UR 10/13/2022 None Detected  NONE DETECTED (Cut Off Level 1000 ng/mL) Final   POC Morphine 10/13/2022 None  Detected  NONE DETECTED (Cut Off Level 300 ng/mL) Final   POC Methadone UR 10/13/2022 None Detected  NONE DETECTED (Cut Off Level 300 ng/mL) Final   POC Oxycodone UR 10/13/2022 None Detected  NONE DETECTED (Cut Off Level 100 ng/mL) Final   POC Marijuana UR 10/13/2022 None Detected  NONE DETECTED (Cut  Off Level 50 ng/mL) Final   SARSCOV2ONAVIRUS 2 AG 10/12/2022 NEGATIVE  NEGATIVE Final   Comment: (NOTE) SARS-CoV-2 antigen NOT DETECTED.   Negative results are presumptive.  Negative results do not preclude SARS-CoV-2 infection and should not be used as the sole basis for treatment or other patient management decisions, including infection  control decisions, particularly in the presence of clinical signs and  symptoms consistent with COVID-19, or in those who have been in contact with the virus.  Negative results must be combined with clinical observations, patient history, and epidemiological information. The expected result is Negative.  Fact Sheet for Patients: https://www.jennings-kim.com/  Fact Sheet for Healthcare Providers: https://alexander-rogers.biz/  This test is not yet approved or cleared by the [place] FDA and  has been authorized for detection and/or diagnosis of SARS-CoV-2 by FDA under an Emergency Use Authorization (EUA).  This EUA will remain in effect (meaning this test can be used) for the duration of  the COV                          ID-19 declaration under Section 564(b)(1) of the Act, 21 U.S.C. section 360bbb-3(b)(1), unless the authorization is terminated or revoked sooner.     Cholesterol 10/12/2022 162  0 - 200 mg/dL Final   Triglycerides 40/98/1191 66  <150 mg/dL Final   HDL 47/82/9562 70  >40 mg/dL Final   Total CHOL/HDL Ratio 10/12/2022 2.3  RATIO Final   VLDL 10/12/2022 13  0 - 40 mg/dL Final   LDL Cholesterol 10/12/2022 79  0 - 99 mg/dL Final   Comment:        Total Cholesterol/HDL:CHD Risk Coronary Heart Disease Risk Table                     Men   Women  1/2 Average Risk   3.4   3.3  Average Risk       5.0   4.4  2 X Average Risk   9.6   7.1  3 X Average Risk  23.4   11.0        Use the calculated Patient Ratio above and the CHD Risk Table to determine the patient's CHD Risk.        ATP III CLASSIFICATION  (LDL):  <100     mg/dL   Optimal  130-865  mg/dL   Near or Above                    Optimal  130-159  mg/dL   Borderline  784-696  mg/dL   High  >295     mg/dL   Very High Performed at [facility], [address] [place], [place] [address]    SARS Coronavirus 2 by RT PCR 10/12/2022 NEGATIVE  NEGATIVE Final   Performed at [facility], [address] [place], [place] [address]    Allergies: Patient has no known allergies.  Medications:  Facility Ordered Medications  Medication   acetaminophen (TYLENOL) tablet 650 mg   alum & mag hydroxide-simeth (MAALOX/MYLANTA) 200-200-20 MG/5ML suspension 30 mL   magnesium  hydroxide (MILK OF MAGNESIA) suspension 30 mL   haloperidol (HALDOL) tablet 5 mg   And   diphenhydrAMINE (BENADRYL) capsule 50 mg   haloperidol lactate (HALDOL) injection 5 mg   And   diphenhydrAMINE (BENADRYL) injection 50 mg   And   LORazepam (ATIVAN) injection 2 mg   haloperidol lactate (HALDOL) injection 10 mg   And   diphenhydrAMINE (BENADRYL) injection 50 mg   And   LORazepam (ATIVAN) injection 2 mg   traZODone (DESYREL) tablet 50 mg   hydrOXYzine (ATARAX) tablet 25 mg      Medical Decision Making  Philip Collins is a 58 y/o male presented to [facility] as a walk in unaccompanied with complaints of worsening substance abuse and depression.    Recommendations  Based on my evaluation the patient does not appear to have an emergency medical condition. Patient will be admitted to [facility] continuous observation for crisis management, safety and stabilization.   Jasper Riling, NP 10/05/23  12:57 AM

## 2023-10-05 NOTE — ED Notes (Signed)
 Patient A&Ox4. Patient denies SI, HI and AVH. Patient denies any physical complaints when asked. No acute distress noted. Support and encouragement provided. Routine safety checks conducted according to facility protocol. Encouraged patient to notify staff if thoughts of harm toward self or others arise. Patient verbalize understanding and agreement. Will continue to monitor for safety.

## 2023-10-05 NOTE — ED Provider Notes (Cosign Needed Addendum)
 Behavioral Health Discharge Note  Date and Time: 10/05/2023 10:31 AM Name: Newt Levingston MRN:  [id number]  Subjective:  "Not Good"  Diagnosis:  Final diagnoses:  Substance induced mood disorder (HCC)  Sheri Prows is a 58 y/o male presented to [facility] as a walk in unaccompanied with complaints of worsening substance abuse and depression.   Rosary Lively, 58 y.o., male patient seen face to face by this provider and chart reviewed on 10/05/23. Patient denies SI/HI or AVH. During evaluation the patient became very tearful stating he "just can't take it any more." The Patient states "I work 2 jobs and I just can't seem to get it together." Patient feels that every time he starts to make progress something happens to put him 3 steps back. The patient states "That makes me want to use drugs and sends me back to the streets." Patient states he has never tried any psychotropic medications and would like to try medication to help manage his anxiety and depression. Patient started on Sertraline 50 mg qday.  During evaluation Euan Wandler is siting  in no acute distress.  He is alert & oriented x 4, tearful, cooperative and attentive for this assessment.  His mood is depressed with congruent affect.  He has normal speech, and behavior.  Objectively there is no evidence of psychosis/mania or delusional thinking. Patient does not appear to be responding to internal or external stimuli.  Patient is able to converse coherently, goal directed thoughts, no distractibility, or pre-occupation.  He also denies suicidal/self-harm/homicidal ideation, psychosis, and paranoia.  Patient answered question appropriately.    Total Time spent with patient: 30 minutes  Past Psychiatric History: No pertinent past psychiatric history Past Medical History: No pertinent past medical history Family History:No pertinent family history. Family Psychiatric  History: Unknown Social History: Patient has a cocaine substance abuse issue.  Patient works two jobs.  Additional Social History:    Pain Medications: See MAR Prescriptions: See MAR Over the Counter: See MAR History of alcohol / drug use?: Yes Longest period of sobriety (when/how long): Unsure. Negative Consequences of Use: Financial Withdrawal Symptoms: None                    Sleep: Good  Appetite:  Good  Current Medications:  Current Facility-Administered Medications  Medication Dose Route Frequency Provider Last Rate Last Admin   acetaminophen (TYLENOL) tablet 650 mg  650 mg Oral Q6H PRN Bobbitt, Shalon E, NP       alum & mag hydroxide-simeth (MAALOX/MYLANTA) 200-200-20 MG/5ML suspension 30 mL  30 mL Oral Q4H PRN Bobbitt, Shalon E, NP       haloperidol (HALDOL) tablet 5 mg  5 mg Oral TID PRN Bobbitt, Shalon E, NP       And   diphenhydrAMINE (BENADRYL) capsule 50 mg  50 mg Oral TID PRN Bobbitt, Shalon E, NP       haloperidol lactate (HALDOL) injection 5 mg  5 mg Intramuscular TID PRN Bobbitt, Shalon E, NP       And   diphenhydrAMINE (BENADRYL) injection 50 mg  50 mg Intramuscular TID PRN Bobbitt, Shalon E, NP       And   LORazepam (ATIVAN) injection 2 mg  2 mg Intramuscular TID PRN Bobbitt, Shalon E, NP       haloperidol lactate (HALDOL) injection 10 mg  10 mg Intramuscular TID PRN Bobbitt, Shalon E, NP       And   diphenhydrAMINE (BENADRYL) injection 50 mg  50 mg Intramuscular TID PRN Bobbitt, Shalon E, NP       And   LORazepam (ATIVAN) injection 2 mg  2 mg Intramuscular TID PRN Bobbitt, Shalon E, NP       hydrOXYzine (ATARAX) tablet 25 mg  25 mg Oral TID PRN Bobbitt, Shalon E, NP       magnesium hydroxide (MILK OF MAGNESIA) suspension 30 mL  30 mL Oral Daily PRN Bobbitt, Shalon E, NP       traZODone (DESYREL) tablet 50 mg  50 mg Oral QHS PRN Bobbitt, Shalon E, NP       Current Outpatient Medications  Medication Sig Dispense Refill   amoxicillin (AMOXIL) 875 MG tablet Take 875 mg by mouth 2 (two) times daily.      Labs  Lab Results:   Admission on 10/05/2023  Component Date Value Ref Range Status   WBC 10/05/2023 6.0  4.0 - 10.5 K/uL Final   RBC 10/05/2023 4.70  4.22 - 5.81 MIL/uL Final   Hemoglobin 10/05/2023 13.9  13.0 - 17.0 g/dL Final   HCT 40/98/1191 40.8  39.0 - 52.0 % Final   MCV 10/05/2023 86.8  80.0 - 100.0 fL Final   MCH 10/05/2023 29.6  26.0 - 34.0 pg Final   MCHC 10/05/2023 34.1  30.0 - 36.0 g/dL Final   RDW 47/82/9562 12.4  11.5 - 15.5 % Final   Platelets 10/05/2023 206  150 - 400 K/uL Final   nRBC 10/05/2023 0.0  0.0 - 0.2 % Final   Neutrophils Relative % 10/05/2023 44  % Final   Neutro Abs 10/05/2023 2.7  1.7 - 7.7 K/uL Final   Lymphocytes Relative 10/05/2023 44  % Final   Lymphs Abs 10/05/2023 2.6  0.7 - 4.0 K/uL Final   Monocytes Relative 10/05/2023 10  % Final   Monocytes Absolute 10/05/2023 0.6  0.1 - 1.0 K/uL Final   Eosinophils Relative 10/05/2023 1  % Final   Eosinophils Absolute 10/05/2023 0.1  0.0 - 0.5 K/uL Final   Basophils Relative 10/05/2023 1  % Final   Basophils Absolute 10/05/2023 0.0  0.0 - 0.1 K/uL Final   Immature Granulocytes 10/05/2023 0  % Final   Abs Immature Granulocytes 10/05/2023 0.00  0.00 - 0.07 K/uL Final   Performed at [facility] Lab, [address] [place], [place] [address]   Alcohol, Ethyl (B) 10/05/2023 <10  <10 mg/dL Final   Comment: (NOTE) Lowest detectable limit for serum alcohol is 10 mg/dL.  For medical purposes only. Performed at [facility], [address] [place], [place] [address]     Blood Alcohol level:  Lab Results  Component Value Date   [facility] <10 10/05/2023   ETH <10 10/12/2022    Metabolic Disorder Labs: Lab Results  Component Value Date   HGBA1C 5.9 (A) 04/23/2022   No results found for: "PROLACTIN" Lab Results  Component Value Date   CHOL 162 10/12/2022   TRIG 66 10/12/2022   HDL 70 10/12/2022   CHOLHDL 2.3 10/12/2022   VLDL 13 10/12/2022   LDLCALC 79 10/12/2022   LDLCALC 81 01/15/2022    Therapeutic Lab  Levels: No results found for: "LITHIUM" No results found for: "VALPROATE" No results found for: "CBMZ"  Physical Findings   PHQ2-9    Flowsheet Row ED from 10/04/2023 in [facility] ED from 10/12/2022 in [facility] Office Visit from 09/20/2022 in [facility]  Eligha Bridegroom [facility] Office Visit from 01/15/2022 in [facility]  PHQ-2 Total Score 2 2 0 0  PHQ-9 Total Score 3 7 0 --      Flowsheet Row ED from 10/05/2023 in [facility] ED from 10/04/2023 in [facility] ED from 10/12/2022 in [facility]  C-SSRS RISK CATEGORY No Risk No Risk No Risk        Musculoskeletal  Strength & Muscle Tone: within normal limits Gait & Station: normal Patient leans: N/A  Psychiatric Specialty Exam  Presentation  General Appearance:  Appropriate for Environment; Casual  Eye Contact: Good  Speech: Clear and Coherent  Speech Volume: Normal  Handedness: Right   Mood and Affect  Mood: Depressed; Dysphoric  Affect: Congruent   Thought Process  Thought Processes: Coherent  Descriptions of Associations:Intact  Orientation:Full (Time, Place and Person)  Thought Content:WDL  Diagnosis of Schizophrenia or Schizoaffective disorder in past: No    Hallucinations:Hallucinations: None  Ideas of Reference:None  Suicidal Thoughts:Suicidal Thoughts: No  Homicidal Thoughts:Homicidal Thoughts: No   Sensorium  Memory: Immediate Good  Judgment: Fair  Insight: Fair   Art therapist  Concentration: Fair  Attention Span: Fair  Recall: Good  Fund of Knowledge: Good  Language: Good   Psychomotor Activity  Psychomotor Activity: Psychomotor Activity: Normal   Assets  Assets: Communication Skills; Desire for Improvement   Sleep  Sleep: Sleep: Fair Number of Hours  of Sleep: 7   Nutritional Assessment (For OBS and FBC admissions only) Has the patient had a weight loss or gain of 10 pounds or more in the last 3 months?: No Has the patient had a decrease in food intake/or appetite?: No Does the patient have dental problems?: No Does the patient have eating habits or behaviors that may be indicators of an eating disorder including binging or inducing vomiting?: No Has the patient recently lost weight without trying?: 0 Has the patient been eating poorly because of a decreased appetite?: 0 Malnutrition Screening Tool Score: 0    Physical Exam  Physical Exam HENT:     Head: Normocephalic.     Nose: Nose normal.  Eyes:     Pupils: Pupils are equal, round, and reactive to light.  Pulmonary:     Effort: Pulmonary effort is normal.  Musculoskeletal:        General: Normal range of motion.     Cervical back: Normal range of motion.  Skin:    General: Skin is dry.  Neurological:     General: No focal deficit present.     Mental Status: He is alert.  ROS Blood pressure 112/61, pulse (!) 55, temperature 97.9 F (36.6 C), temperature source Oral, resp. rate 17, SpO2 100%. There is no height or weight on file to calculate BMI.  Treatment Plan Summary: Medication management and Plan The patient is appropriate for inpatient admission. Patient has been admitted to [facility].  De Burrs, NP 10/05/2023 10:31 AM
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
## 2023-10-05 NOTE — BH Assessment (Incomplete)
 Pt was discharged     Philip Collins is still here in the lobby. He said that he feels like he is a danger to himself and he is afraid to leave. Can I have a nurse come out to see him.

## 2023-10-05 NOTE — Progress Notes (Signed)
 Patient aware of pending admission to [facility]. Patient remains calm and cooperative in unit with no s/s of current distress.

## 2023-10-05 NOTE — Tx Team (Signed)
 Initial Treatment Plan 10/05/2023 7:04 PM [name] [id number]    PATIENT STRESSORS: Financial difficulties   Loss of relationship   Marital or family conflict   Substance abuse     PATIENT STRENGTHS: Ability for insight  Work skills    PATIENT IDENTIFIED PROBLEMS: Anxiety  Depression  Substance use disorder  Crying spells  Panic attack  Loneliness            DISCHARGE CRITERIA:  Ability to meet basic life and health needs Withdrawal symptoms are absent or subacute and managed without 24-hour nursing intervention  PRELIMINARY DISCHARGE PLAN: Return to previous work or school arrangements  PATIENT/FAMILY INVOLVEMENT: This treatment plan has been presented to and reviewed with the patient, Philip Collins, has been given the opportunity to ask questions and make suggestions.  Melvenia Needles, RN 10/05/2023, 7:04 PM

## 2023-10-05 NOTE — Group Note (Signed)
 Date:  10/05/2023 Time:  9:31 PM  Group Topic/Focus:  Wrap-Up Group:   The focus of this group is to help patients review their daily goal of treatment and discuss progress on daily workbooks.    Participation Level:  Active  Participation Quality:  Appropriate  Affect:  Appropriate  Cognitive:  Appropriate  Insight: Appropriate  Engagement in Group:  Engaged  Modes of Intervention:  Education and Exploration  Additional Comments:  Patient attended and participated in group tonight. He reports that coming in for treatment today was the best thing for him today  Scot Dock 10/05/2023, 9:31 PM

## 2023-10-05 NOTE — ED Notes (Signed)
 Patient ate breakfast and ate lunch. UDS obtained. Patient denies SI,HI, and A/V/H with no plan or intent but states feeling "very depressed with no where to go." Patient remains calm and cooperative in unit at this time.

## 2023-10-06 ENCOUNTER — Encounter (HOSPITAL_COMMUNITY): Payer: Self-pay

## 2023-10-06 DIAGNOSIS — F332 Major depressive disorder, recurrent severe without psychotic features: Secondary | ICD-10-CM | POA: Diagnosis not present

## 2023-10-06 DIAGNOSIS — F1210 Cannabis abuse, uncomplicated: Secondary | ICD-10-CM | POA: Diagnosis present

## 2023-10-06 DIAGNOSIS — F419 Anxiety disorder, unspecified: Secondary | ICD-10-CM | POA: Diagnosis present

## 2023-10-06 LAB — LIPID PANEL
Cholesterol: 157 mg/dL (ref 0–200)
HDL: 67 mg/dL (ref >40)
LDL Cholesterol: 64 mg/dL (ref 0–99)
Total CHOL/HDL Ratio: 2.3 ratio
Triglycerides: 132 mg/dL (ref <150)
VLDL: 26 mg/dL (ref 0–40)

## 2023-10-06 LAB — TSH: TSH: 1.144 u[IU]/mL (ref 0.350–4.500)

## 2023-10-06 LAB — HEMOGLOBIN A1C
Hgb A1c MFr Bld: 5.5 % (ref 4.8–5.6)
Mean Plasma Glucose: 111.15 mg/dL

## 2023-10-06 LAB — VITAMIN B12: Vitamin B-12: 412 pg/mL (ref 180–914)

## 2023-10-06 LAB — FOLATE: Folate: 9 ng/mL (ref >5.9)

## 2023-10-06 LAB — VITAMIN D 25 HYDROXY (VIT D DEFICIENCY, FRACTURES): Vit D, 25-Hydroxy: 16.11 ng/mL — ABNORMAL LOW (ref 30–100)

## 2023-10-06 MED ORDER — TRAZODONE HCL 50 MG PO TABS
50.0000 mg | ORAL_TABLET | Freq: Every evening | ORAL | Status: DC | PRN
  Administered 2023-10-06 – 2023-10-09 (×4): 50 mg via ORAL
  Filled 2023-10-06 (×4): qty 1

## 2023-10-06 MED ORDER — BUPROPION HCL ER (XL) 150 MG PO TB24
150.0000 mg | ORAL_TABLET | Freq: Every day | ORAL | Status: DC
Start: 2023-10-07 — End: 2023-10-09
  Administered 2023-10-07 – 2023-10-09 (×3): 150 mg via ORAL
  Filled 2023-10-06 (×4): qty 1

## 2023-10-06 MED ORDER — NALTREXONE HCL 50 MG PO TABS
25.0000 mg | ORAL_TABLET | Freq: Every day | ORAL | Status: DC
  Administered 2023-10-07: 25 mg via ORAL
  Filled 2023-10-06 (×3): qty 1

## 2023-10-06 NOTE — Group Note (Signed)
 Date:  10/06/2023 Time:  11:06 AM  Group Topic/Focus:  Developing a Wellness Toolbox:   The focus of this group is to help patients develop a "wellness toolbox" with skills and strategies to promote recovery upon discharge. Dimensions of Wellness:   The focus of this group is to introduce the topic of wellness and discuss the role each dimension of wellness plays in total health.    Participation Level:  Active  Participation Quality:  Attentive  Affect:  Appropriate  Cognitive:  Appropriate  Insight: Appropriate  Engagement in Group:  Supportive  Modes of Intervention:  Socialization and Support  Additional Comments: Patient acknowledges emotions and will choose positive thoughts.     Merlene Morse 10/06/2023, 11:06 AM

## 2023-10-06 NOTE — [facility] Group Notes (Signed)
 [facility] Group Notes:  (Nursing/MHT/Case Management/Adjunct)  Date:  10/06/2023  Time:2000  Type of Therapy:   Alcoholics Anonymous Meeting  Participation Level:  Active  Participation Quality:  Appropriate, Attentive, Sharing, and Supportive  Affect:  Flat  Cognitive:  Alert  Insight:  Improving  Engagement in Group:  Engaged  Modes of Intervention:  Clarification, Education, and Support  Summary of Progress/Problems:  Marcille Buffy 10/06/2023, 9:53 PM

## 2023-10-06 NOTE — H&P (Cosign Needed Addendum)
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 condition.    Starleen Blue, NP 3/31/20257:07 PM

## 2023-10-06 NOTE — Group Note (Signed)
 Date:  10/06/2023 Time:  2:27 PM  Group Topic/Focus:  Healthy Communication:   The focus of this group is to discuss communication, barriers to communication, as well as healthy ways to communicate with others.    Participation Level:  Active  Participation Quality:  Appropriate  Affect:  Appropriate  Cognitive:  Appropriate  Insight: Appropriate  Engagement in Group:  Supportive  Modes of Intervention:  Problem-solving and Role-play  Additional Comments:    Merlene Morse 10/06/2023, 2:27 PM

## 2023-10-06 NOTE — [facility] Suicide Risk Assessment (Signed)
 Suicide Risk Assessment  Admission Assessment    [facility] Admission Suicide Risk Assessment   Nursing information obtained from:  Patient Demographic factors:  Male, Low socioeconomic status Current Mental Status:  NA Loss Factors:  Financial problems / change in socioeconomic status Historical Factors:  NA Risk Reduction Factors:  Positive coping skills or problem solving skills  Total Time spent with patient: 1.5 hours Principal Problem: MDD (major depressive disorder), recurrent severe, without psychosis (HCC) Diagnosis:  Principal Problem:   MDD (major depressive disorder), recurrent severe, without psychosis (HCC) Active Problems:   Nicotine use disorder   Cocaine use disorder (HCC)   Tetrahydrocannabinol (THC) use disorder, mild, abuse   Anxiety  Subjective Data: cocaine use d/o passive SI. Depressive symptoms , in need of continuous hospitalization for treatment and stabilization of mental status.  Continued Clinical Symptoms:  depressed mood, anhedonia, insomnia, psychomotor retardation, fatigue, feelings of worthlessness/guilt, difficulty concentrating, hopelessness, recurrent thoughts of death, suicidal thoughts with specific plan, anxiety, loss of energy/fatigue, disturbed sleep,  Alcohol Use Disorder Identification Test Final Score (AUDIT): 0 The "Alcohol Use Disorders Identification Test", Guidelines for Use in Primary Care, Second Edition.  World Science writer [facility]). Score between 0-7:  no or low risk or alcohol related problems. Score between 8-15:  moderate risk of alcohol related problems. Score between 16-19:  high risk of alcohol related problems. Score 20 or above:  warrants further diagnostic evaluation for alcohol dependence and treatment.   CLINICAL FACTORS:   Alcohol/Substance Abuse/Dependencies Previous Psychiatric Diagnoses and Treatments   Musculoskeletal: Strength & Muscle Tone: within normal limits Gait & Station: normal Patient  leans: Right  Psychiatric Specialty Exam:  Presentation  General Appearance:  Appropriate for Environment  Eye Contact: Fair  Speech: Clear and Coherent  Speech Volume: Normal  Handedness: Right   Mood and Affect  Mood: Depressed; Anxious  Affect: Congruent   Thought Process  Thought Processes: Coherent  Descriptions of Associations:Intact  Orientation:Full (Time, Place and Person)  Thought Content:Logical  History of Schizophrenia/Schizoaffective disorder:No  Duration of Psychotic Symptoms:No data recorded Hallucinations:Hallucinations: None  Ideas of Reference:None  Suicidal Thoughts:Suicidal Thoughts: No  Homicidal Thoughts:Homicidal Thoughts: No   Sensorium  Memory: Immediate Fair  Judgment: Fair  Insight: Fair   Art therapist  Concentration: Fair  Attention Span: Fair  Recall: [employer] of Knowledge: Fair  Language: Fair   Psychomotor Activity  Psychomotor Activity: Psychomotor Activity: Normal   Assets  Assets: Communication Skills; Resilience   Sleep  Sleep: Sleep: Poor Number of Hours of Sleep: 7    Physical Exam: Physical Exam Vitals and nursing note reviewed.  Constitutional:      Appearance: Normal appearance.  Neurological:     General: No focal deficit present.     Mental Status: He is alert and oriented to person, place, and time.    Review of Systems  Psychiatric/Behavioral:  Positive for depression, substance abuse and suicidal ideas (passive). Negative for hallucinations and memory loss. The patient is nervous/anxious and has insomnia.    Blood pressure 132/86, pulse (!) 57, temperature 98.3 F (36.8 C), temperature source Oral, resp. rate (!) 8, height 5\' 11"  (1.803 m), weight 67.4 kg, SpO2 100%. Body mass index is 20.73 kg/m.   COGNITIVE FEATURES THAT CONTRIBUTE TO RISK:  None    SUICIDE RISK:   Moderate:  Frequent suicidal ideation with limited intensity, and duration, some  specificity in terms of plans, no associated intent, good self-control, limited dysphoria/symptomatology, some risk factors present, and identifiable protective  factors, including available and accessible social support.  PLAN OF CARE: See H & P  I certify that inpatient services furnished can reasonably be expected to improve the patient's condition.   Starleen Blue, NP 10/06/2023, 7:10 PM
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
## 2023-10-06 NOTE — Group Note (Signed)
 Recreation Therapy Group Note   Group Topic:Healthy Decision Making  Group Date: 10/06/2023 Start Time: 1610 End Time: 1015 Facilitators: Daveigh Batty-McCall, LRT,CTRS Location: [address] Dayroom   Group Topic: Decision Making, Problem Solving, Communication  Goal Area(s) Addresses:  Patient will effectively work with peer towards shared goal.  Patient will identify factors that guided their decision making.  Patient will pro-socially communicate ideas during group session.   Intervention: Survival Scenario - pencil, paper  Activity: Patients were given a scenario that they were going to be stranded on a deserted [place] for several months before being rescued. Writer tasked them with making a list of 15 things they would choose to bring with them for "survival". The list of items was prioritized most important to least. Each patient would come up with their own list, then work together to create a new list of 15 items while in a group of 3-5 peers. LRT discussed each person's list and how it differed from others. The debrief included discussion of priorities, good decisions versus bad decisions, and how it is important to think before acting so we can make the best decision possible. LRT tied the concept of effective communication among group members to patient's support systems outside of the hospital and its benefit post discharge.  Education: Pharmacist, community, Priorities, Support System, Discharge Planning   Education Outcome: Acknowledges education/In group clarification/Needs additional education   Affect/Mood: Appropriate   Participation Level: Engaged   Participation Quality: Independent   Behavior: Appropriate   Speech/Thought Process: Focused   Insight: Good   Judgement: Good   Modes of Intervention: Group work   Patient Response to Interventions:  Engaged   Education Outcome:  In group clarification offered    Clinical Observations/Individualized Feedback: Pt  was bright and shared his sense of humor. Pt was able to identify items with peer such as OFF, air mattress, cases of canned food for two people, lighter, tent and gun/bullets. Pt also expressed how the activity made him anxious listening to some of the answers from his peers. Pt was able to work through those concerns and complete assignment.    Plan: Continue to engage patient in RT group sessions 2-3x/week.   Yazmeen Woolf-McCall, LRT,CTRS 10/06/2023 1:12 PM

## 2023-10-06 NOTE — Group Note (Signed)
 Date:  10/06/2023 Time:  9:58 AM  Group Topic/Focus:  Goals Group:   The focus of this group is to help patients establish daily goals to achieve during treatment and discuss how the patient can incorporate goal setting into their daily lives to aide in recovery.    Participation Level:  Active  Participation Quality:  Appropriate  Affect:  Appropriate  Cognitive:  Appropriate  Insight: Appropriate  Engagement in Group:  Supportive  Modes of Intervention:  Socialization and Support  Additional Comments:  Patient has clear goals set for today.  Merlene Morse 10/06/2023, 9:58 AM

## 2023-10-06 NOTE — Plan of Care (Signed)
 Pt presents with anxious expression and depressed affect. Denies SI, HI, AVH, and pain. Rates anxiety 4/10 and depression 5/10. Cooperative and calm in interactions with staff. Pt was observed in the dayroom attending group and socializing with peers. Safety checks maintained at q 15 minutes. Support, encouragement, and reassurance offered to the pt.   Problem: Education: Goal: Knowledge of [place] [facility] Education information/materials will improve Outcome: Progressing Goal: Emotional status will improve Outcome: Progressing Goal: Mental status will improve Outcome: Progressing   Problem: Activity: Goal: Interest or engagement in activities will improve Outcome: Progressing   Problem: Safety: Goal: Periods of time without injury will increase Outcome: Progressing

## 2023-10-06 NOTE — BH IP Treatment Plan (Signed)
 Interdisciplinary Treatment and Diagnostic Plan Update  10/06/2023 Time of Session: [address] MRN: [id number]  Principal Diagnosis: MDD (major depressive disorder), recurrent severe, without psychosis (HCC)  Secondary Diagnoses: Principal Problem:   MDD (major depressive disorder), recurrent severe, without psychosis (HCC)   Current Medications:  Current Facility-Administered Medications  Medication Dose Route Frequency Provider Last Rate Last Admin   acetaminophen (TYLENOL) tablet 650 mg  650 mg Oral Q6H PRN McLauchlin, Angela, NP       alum & mag hydroxide-simeth (MAALOX/MYLANTA) 200-200-20 MG/5ML suspension 30 mL  30 mL Oral Q4H PRN McLauchlin, Angela, NP       haloperidol (HALDOL) tablet 5 mg  5 mg Oral TID PRN McLauchlin, Angela, NP       And   diphenhydrAMINE (BENADRYL) capsule 50 mg  50 mg Oral TID PRN McLauchlin, Angela, NP       haloperidol lactate (HALDOL) injection 5 mg  5 mg Intramuscular TID PRN McLauchlin, Angela, NP       And   diphenhydrAMINE (BENADRYL) injection 50 mg  50 mg Intramuscular TID PRN McLauchlin, Angela, NP       And   LORazepam (ATIVAN) injection 2 mg  2 mg Intramuscular TID PRN McLauchlin, Angela, NP       haloperidol lactate (HALDOL) injection 10 mg  10 mg Intramuscular TID PRN McLauchlin, Angela, NP       And   diphenhydrAMINE (BENADRYL) injection 50 mg  50 mg Intramuscular TID PRN McLauchlin, Angela, NP       And   LORazepam (ATIVAN) injection 2 mg  2 mg Intramuscular TID PRN McLauchlin, Marylene Land, NP       hydrOXYzine (ATARAX) tablet 25 mg  25 mg Oral TID PRN McLauchlin, Angela, NP       magnesium hydroxide (MILK OF MAGNESIA) suspension 30 mL  30 mL Oral Daily PRN McLauchlin, Angela, NP       sertraline (ZOLOFT) tablet 50 mg  50 mg Oral Daily McLauchlin, Angela, NP   50 mg at 10/06/23 4098   PTA Medications: Medications Prior to Admission  Medication Sig Dispense Refill Last Dose/Taking   amoxicillin (AMOXIL) 875 MG tablet Take 875 mg by  mouth 2 (two) times daily.       Patient Stressors: Financial difficulties   Loss of relationship   Marital or family conflict   Substance abuse    Patient Strengths: Ability for insight  Work skills   Treatment Modalities: Medication Management, Group therapy, Case management,  1 to 1 session with clinician, Psychoeducation, Recreational therapy.   Physician Treatment Plan for Primary Diagnosis: MDD (major depressive disorder), recurrent severe, without psychosis (HCC) Long Term Goal(s):     Short Term Goals:    Medication Management: Evaluate patient's response, side effects, and tolerance of medication regimen.  Therapeutic Interventions: 1 to 1 sessions, Unit Group sessions and Medication administration.  Evaluation of Outcomes: Not Progressing  Physician Treatment Plan for Secondary Diagnosis: Principal Problem:   MDD (major depressive disorder), recurrent severe, without psychosis (HCC)  Long Term Goal(s):     Short Term Goals:       Medication Management: Evaluate patient's response, side effects, and tolerance of medication regimen.  Therapeutic Interventions: 1 to 1 sessions, Unit Group sessions and Medication administration.  Evaluation of Outcomes: Not Progressing   RN Treatment Plan for Primary Diagnosis: MDD (major depressive disorder), recurrent severe, without psychosis (HCC) Long Term Goal(s): Knowledge of disease and therapeutic regimen to maintain health will improve  Short Term  Goals: Ability to verbalize frustration and anger appropriately will improve, Ability to demonstrate self-control, Ability to verbalize feelings will improve, Ability to disclose and discuss suicidal ideas, Ability to identify and develop effective coping behaviors will improve, and Compliance with prescribed medications will improve  Medication Management: RN will administer medications as ordered by provider, will assess and evaluate patient's response and provide education to  patient for prescribed medication. RN will report any adverse and/or side effects to prescribing provider.  Therapeutic Interventions: 1 on 1 counseling sessions, Psychoeducation, Medication administration, Evaluate responses to treatment, Monitor vital signs and CBGs as ordered, Perform/monitor CIWA, COWS, AIMS and Fall Risk screenings as ordered, Perform wound care treatments as ordered.  Evaluation of Outcomes: Not Progressing   LCSW Treatment Plan for Primary Diagnosis: MDD (major depressive disorder), recurrent severe, without psychosis (HCC) Long Term Goal(s): Safe transition to appropriate next level of care at discharge, Engage patient in therapeutic group addressing interpersonal concerns.  Short Term Goals: Engage patient in aftercare planning with referrals and resources, Increase emotional regulation, Facilitate patient progression through stages of change regarding substance use diagnoses and concerns, Identify triggers associated with mental health/substance abuse issues, and Increase skills for wellness and recovery  Therapeutic Interventions: Assess for all discharge needs, 1 to 1 time with Social worker, Explore available resources and support systems, Assess for adequacy in community support network, Educate family and significant other(s) on suicide prevention, Complete Psychosocial Assessment, Interpersonal group therapy.  Evaluation of Outcomes: Not Progressing   Progress in Treatment: Attending groups: Yes. Participating in groups: Yes. Taking medication as prescribed: Yes. Toleration medication: Yes. Family/Significant other contact made: No, will contact:  Pt declined consents Patient understands diagnosis: Yes. Discussing patient identified problems/goals with staff: Yes. Medical problems stabilized or resolved: Yes. Denies suicidal/homicidal ideation: Yes. Issues/concerns per patient self-inventory: No. Other: N/a  New problem(s) identified: No, Describe:   No  New Short Term/Long Term Goal(s): detox, medication management for mood stabilization; elimination of SI thoughts; development of comprehensive mental wellness/sobriety plan  Patient Goals:  "Resources for substance abuse treatment"  Discharge Plan or Barriers: Patient recently admitted. CSW will continue to follow and assess for appropriate referrals and possible discharge planning.    Reason for Continuation of Hospitalization: Medication stabilization Withdrawal symptoms Other; describe Mood stabilization, discharge planning  Estimated Length of Stay: 3-5 days  Last 3 [place] Suicide Severity Risk Score: Flowsheet Row Ad[facility] (Current) [facility] Most recent reading at 10/05/2023  6:00 PM ED from 10/05/2023 in [facility] Most recent reading at 10/05/2023 12:53 AM ED from 10/04/2023 in [facility] Most recent reading at 10/04/2023  9:21 PM  C-SSRS RISK CATEGORY No Risk No Risk No Risk       Last PHQ 2/9 Scores:    10/04/2023   10:30 PM 10/19/2022   11:07 AM 10/15/2022   12:15 PM  Depression screen PHQ 2/9  Decreased Interest 1 1 0  Down, Depressed, Hopeless 1 1 1   PHQ - 2 Score 2 2 1   Altered sleeping 0 3   Tired, decreased energy 0 1   Change in appetite 1 0   Feeling bad or failure about yourself  0 1   Trouble concentrating 0 0   Moving slowly or fidgety/restless 0 0   Suicidal thoughts 0 0   PHQ-9 Score 3 7   Difficult doing work/chores Somewhat difficult Somewhat difficult     Scribe for Treatment Team:  Joelyn Oms [place], LCSW 10/06/2023 4:45 PM

## 2023-10-06 NOTE — Plan of Care (Signed)
  Problem: Education: Goal: Emotional status will improve Outcome: Progressing Goal: Verbalization of understanding the information provided will improve Outcome: Progressing   Problem: Activity: Goal: Sleeping patterns will improve Outcome: Progressing   Problem: Health Behavior/Discharge Planning: Goal: Identification of resources available to assist in meeting health care needs will improve Outcome: Progressing   Problem: Safety: Goal: Periods of time without injury will increase Outcome: Progressing

## 2023-10-06 NOTE — Progress Notes (Signed)
   10/06/23 1000  Psych Admission Type (Psych Patients Only)  Admission Status Voluntary  Psychosocial Assessment  Patient Complaints Anxiety;Depression  Eye Contact Fair  Facial Expression Anxious  Affect Anxious  Speech Logical/coherent  Interaction Assertive  Motor Activity Other (Comment) (WNl)  Appearance/Hygiene In scrubs  Behavior Characteristics Cooperative;Calm  Mood Depressed;Anxious  Thought Process  Coherency WDL  Content WDL  Delusions None reported or observed  Perception WDL  Hallucination None reported or observed  Judgment WDL  Confusion None  Danger to Self  Current suicidal ideation? Denies  Agreement Not to Harm Self Yes  Description of Agreement verbal  Danger to Others  Danger to Others None reported or observed

## 2023-10-06 NOTE — Progress Notes (Addendum)
   10/06/23 2130  Psych Admission Type (Psych Patients Only)  Admission Status Voluntary  Psychosocial Assessment  Patient Complaints Anxiety;Depression  Eye Contact Fair  Affect Anxious  Speech Logical/coherent  Interaction Assertive  Motor Activity Other (Comment) (wnl)  Appearance/Hygiene Unremarkable  Behavior Characteristics Cooperative;Anxious  Mood Anxious;Depressed;Pleasant  Thought Process  Coherency WDL  Content WDL  Delusions None reported or observed  Perception WDL  Hallucination None reported or observed  Judgment WDL  Confusion None  Danger to Self  Current suicidal ideation? Denies  Agreement Not to Harm Self Yes  Description of Agreement verbal  Danger to Others  Danger to Others None reported or observed   Progress note   D: Pt seen at med window. Pt denies SI, HI, AVH. Pt rates pain  0/10. Pt rates anxiety  5/10 and depression  5/10. Pt states anxiety and depression r/t thoughts that he would lose his job. "I was sitting in group and thinking that I had to call my boss. I called afterwards and was straight with him about where I am and he said it was fine." Pt has depressed affect. "I'm tired of doing this over and over. I don't have a good relationship with my kids, my siblings. I need a better job. After paying child support, I have less than $400 and most of that goes to rent. I got food stamps but they only gave me $25 this month. I don't know why." Pt states that he wouldn't mind if he went to sleep and didn't wake up but has no intent or desire to kill himself. Contracts for safety. States he is here to get help. No other concerns noted at this time.  A: Pt provided support and encouragement. Pt given scheduled medication as prescribed. PRNs as appropriate. Q15 min checks for safety.   R: Pt safe on the unit. Will continue to monitor.

## 2023-10-07 DIAGNOSIS — F332 Major depressive disorder, recurrent severe without psychotic features: Secondary | ICD-10-CM | POA: Diagnosis not present

## 2023-10-07 LAB — RPR: RPR Ser Ql: NONREACTIVE

## 2023-10-07 MED ORDER — ENSURE ENLIVE PO LIQD
237.0000 mL | Freq: Three times a day (TID) | ORAL | Status: DC
  Administered 2023-10-07 – 2023-10-10 (×9): 237 mL via ORAL
  Filled 2023-10-07 (×15): qty 237

## 2023-10-07 MED ORDER — VITAMIN D3 25 MCG PO TABS
2000.0000 [IU] | ORAL_TABLET | Freq: Two times a day (BID) | ORAL | Status: DC
  Administered 2023-10-08 – 2023-10-10 (×5): 2000 [IU] via ORAL
  Filled 2023-10-07 (×9): qty 2

## 2023-10-07 MED ORDER — NALTREXONE HCL 50 MG PO TABS
50.0000 mg | ORAL_TABLET | Freq: Every day | ORAL | Status: DC
  Administered 2023-10-08 – 2023-10-10 (×3): 50 mg via ORAL
  Filled 2023-10-07 (×5): qty 1

## 2023-10-07 MED ORDER — VITAMIN D (ERGOCALCIFEROL) 1.25 MG (50000 UNIT) PO CAPS
50000.0000 [IU] | ORAL_CAPSULE | ORAL | Status: DC
  Administered 2023-10-07: 50000 [IU] via ORAL
  Filled 2023-10-07 (×2): qty 1

## 2023-10-07 NOTE — Progress Notes (Signed)
  Rosary Lively   Type of Note: Shelter List  Pt was given list of shelters and was encouraged to begin calling. Pt aware and agreeable. Pt reports possibly having someone he can stay with until able to return to [facility]. Will continue to assist as needed.   Signed:  Madhav Mohon, LCSW-A 10/07/2023  2:17 PM

## 2023-10-07 NOTE — Progress Notes (Signed)
 [facility] MD Progress Note  10/07/2023 2:44 PM Philip Collins  MRN:  [id number]  Principal Problem: MDD (major depressive disorder), recurrent severe, without psychosis (HCC) Diagnosis: Principal Problem:   MDD (major depressive disorder), recurrent severe, without psychosis (HCC) Active Problems:   Nicotine use disorder   Cocaine use disorder (HCC)   Tetrahydrocannabinol (THC) use disorder, mild, abuse   Anxiety  HPI: Philip Collins is a 58 y.o.AA male with a prior history of cocaine use d/o who presented to the [facility] on 03/29 with complaints of depressive symptoms & psychosocial stressors status post relapsing on crack cocaine, and being kicked out of an [facility].  Patient was initially discharged, refused to leave the lobby, & as per [facility] documentation: "Pt never left the lobby after he was discharged and as per [facility] reports "feels like he is a danger to himself" to registration."  He was reassessed by provider, and presented with passive SI. Per provider at [facility]:  "Patient is stating that he does not know what he may do to himself if he is discharged. Patient has some passive suicidal ideation with no plan or intent." (Bobbitt, Franchot Mimes, NP, Date of Service: 10/05/2023 12:51 AM).  24 hr chart review: Prior to encounter with pt, chart was reviewed, case discussed with treatment team.  V/S reflect some hypotension today with HR of 52. Fluids encouraged. Trazodone & Hydroxyzine are the PRN meds that were given overnight for sleep and anxiety respectively. Slept for 7.5 hrs as per nursing documentation. No behavioral concerns in the past 25 hrs.  Today's patient assessment note: Patient remains very depressed as per both objective & subjective assessments, and is continuing to report passive SI, stating that his life has no purpose, and that if he goes to sleep and, doesn't, wake up, "it will be perfectly fine with me because I will not be suffering any more. I am  too old to be going through this struggle." Patient perseverates about how his whole life has been a struggle, states that  he is taking responsibility for his actions, asking for help & wanting to be a better person, wanting to regain and maintain his sobriety, verbalizing motivation to go to substance abuse rehab pending his return to the [facility]. He is able to verbally contract for safety while here on the unit, denies having a  plan to harm himself currently. He denies HI/AVH, denies paranoia and denies first rank symptoms, there are no overt signs of psychosis.  Patient reports today that "I feel like I am not myself". He is asked to elaborate and, states: "I don't feel like myself, and I don't even look like my self. I feel bad. I look frail. I feel so weak. So tired."  Patient is being encouraged to stay hydrated, asking, for Ensures in between meals & we will order this for patient for nutritional supplementation. He reports that sleep last night was fair, denies pain, moving bowels well.  We medication adjustments including discontinuing Zoloft,, and starting Wellbutrin 150 mg for management of depressive symptoms and cocaine use disorder, and also adding naltrexone 25 mg for cocaine cravings, with plans to titrate this medication upwards to 50 mg starting tomorrow, 4/2.  Patient is agreeable to plan.  We are continuing other medications as listed below.  Labs reviewed: Vitamin D is low at 16.11, supplemented with 50,000 units weekly.  Other labs reviewed, no new orders at this time.  Total Time spent with patient: 45 minutes  Past Psychiatric History: See H & P  Past Medical History:  Past Medical History:  Diagnosis Date   BPH with obstruction/lower urinary tract symptoms    Bradycardia 01/15/2022   Broken tooth 01/15/2022   Erectile dysfunction    Incomplete bladder emptying 01/15/2022   Lump of left breast 02/13/2022   Prediabetes 02/13/2022   Substance abuse (HCC)    drug     Past Surgical History:  Procedure Laterality Date   BUNIONECTOMY Bilateral    ~30 years ago   COLONOSCOPY WITH PROPOFOL N/A 03/20/2022   Procedure: COLONOSCOPY WITH PROPOFOL;  Surgeon: Wyline Mood, MD;  Location: [facility] ENDOSCOPY;  Service: Gastroenterology;  Laterality: N/A;   KNEE ARTHROSCOPY Left    ~when patient was [age]   Family History:  Family History  Problem Relation Age of Onset   Hypertension Mother    Diabetes Mother    Kidney failure Mother    Other Father        "gum disease"   Hypertension Sister    Hypertension Sister    Other Maternal Grandmother        unknown medical history   Dementia Maternal Grandfather    Other Paternal Grandmother        unknown medical history   Other Paternal Grandfather        unknown medical history   Prostate cancer Neg Hx    Bladder Cancer Neg Hx    Kidney cancer Neg Hx    Breast cancer Neg Hx    Family Psychiatric  History: See H & P Social History:  Social History   Substance and Sexual Activity  Alcohol Use Not Currently   Comment: occassional, last use 10/09/21     Social History   Substance and Sexual Activity  Drug Use Not Currently   Types: Marijuana, Cocaine, "Crack" cocaine, Methamphetamines   Comment: last use 10/09/21    Social History   Socioeconomic History   Marital status: Divorced    Spouse name: Not on file   Number of children: 4   Years of education: Not on file   Highest education level: Not on file  Occupational History   Not on file  Tobacco Use   Smoking status: Every Day    Current packs/day: 0.50    Average packs/day: 0.5 packs/day for 37.0 years (18.5 ttl pk-yrs)    Types: Cigarettes    Passive exposure: Current   Smokeless tobacco: Never   Tobacco comments:    Patient is at [facility] since 10/10/21, previous to that he was homeless    Patient trying to quit smoking. Patient states he has bought his last pack  Vaping Use   Vaping status: Never Used  Substance and Sexual Activity    Alcohol use: Not Currently    Comment: occassional, last use 10/09/21   Drug use: Not Currently    Types: Marijuana, Cocaine, "Crack" cocaine, Methamphetamines    Comment: last use 10/09/21   Sexual activity: Not Currently  Other Topics Concern   Not on file  Social History Narrative   Lives [facility] living [facility] [place].    Social Drivers of Corporate investment banker Strain: Not on file  Food Insecurity: Food Insecurity Present (10/05/2023)   Hunger Vital Sign    Worried About Running Out of Food in the Last Year: Often true    Ran Out of Food in the Last Year: Often true  Transportation Needs: Unmet Transportation Needs (10/05/2023)   PRAPARE - Transportation  Lack of Transportation (Medical): Yes    Lack of Transportation (Non-Medical): Yes  Physical Activity: Not on file  Stress: Not on file  Social Connections: Not on file   Sleep: Good  Appetite:  Fair  Current Medications: Current Facility-Administered Medications  Medication Dose Route Frequency Provider Last Rate Last Admin   acetaminophen (TYLENOL) tablet 650 mg  650 mg Oral Q6H PRN McLauchlin, Angela, NP       alum & mag hydroxide-simeth (MAALOX/MYLANTA) 200-200-20 MG/5ML suspension 30 mL  30 mL Oral Q4H PRN McLauchlin, Angela, NP       buPROPion (WELLBUTRIN XL) 24 hr tablet 150 mg  150 mg Oral Daily Starleen Blue, NP   150 mg at 10/07/23 3329   haloperidol (HALDOL) tablet 5 mg  5 mg Oral TID PRN McLauchlin, Marylene Land, NP       And   diphenhydrAMINE (BENADRYL) capsule 50 mg  50 mg Oral TID PRN McLauchlin, Angela, NP       haloperidol lactate (HALDOL) injection 5 mg  5 mg Intramuscular TID PRN McLauchlin, Angela, NP       And   diphenhydrAMINE (BENADRYL) injection 50 mg  50 mg Intramuscular TID PRN McLauchlin, Angela, NP       And   LORazepam (ATIVAN) injection 2 mg  2 mg Intramuscular TID PRN McLauchlin, Angela, NP       haloperidol lactate (HALDOL) injection 10 mg  10 mg Intramuscular TID PRN McLauchlin, Angela, NP        And   diphenhydrAMINE (BENADRYL) injection 50 mg  50 mg Intramuscular TID PRN McLauchlin, Angela, NP       And   LORazepam (ATIVAN) injection 2 mg  2 mg Intramuscular TID PRN McLauchlin, Angela, NP       hydrOXYzine (ATARAX) tablet 25 mg  25 mg Oral TID PRN McLauchlin, Marylene Land, NP   25 mg at 10/06/23 2134   magnesium hydroxide (MILK OF MAGNESIA) suspension 30 mL  30 mL Oral Daily PRN McLauchlin, Marylene Land, NP       naltrexone (DEPADE) tablet 25 mg  25 mg Oral Daily Tyquan Carmickle, NP   25 mg at 10/07/23 0835   traZODone (DESYREL) tablet 50 mg  50 mg Oral QHS PRN Starleen Blue, NP   50 mg at 10/06/23 2134   Vitamin D (Ergocalciferol) (DRISDOL) 1.25 MG (50000 UNIT) capsule 50,000 Units  50,000 Units Oral Q7 days Golda Acre, MD   50,000 Units at 10/07/23 0836   [START ON 10/08/2023] vitamin D3 (CHOLECALCIFEROL) tablet 2,000 Units  2,000 Units Oral BID Golda Acre, MD        Lab Results:  Results for orders placed or performed during the hospital encounter of 10/05/23 (from the past 48 hours)  Folate     Status: None   Collection Time: 10/06/23  6:41 PM  Result Value Ref Range   Folate 9.0 >5.9 ng/mL    Comment: Performed at [facility], [address], [place], [place] [address]  Hemoglobin A1c     Status: None   Collection Time: 10/06/23  6:41 PM  Result Value Ref Range   Hgb A1c MFr Bld 5.5 4.8 - 5.6 %    Comment: (NOTE) Pre diabetes:          5.7%-6.4%  Diabetes:              >6.4%  Glycemic control for   <7.0% adults with diabetes    Mean Plasma Glucose 111.15 mg/dL  Comment: Performed at [facility], [address], [place], [place] [address]  Lipid panel     Status: None   Collection Time: 10/06/23  6:41 PM  Result Value Ref Range   Cholesterol 157 0 - 200 mg/dL   Triglycerides 604 <540 mg/dL   HDL 67 >98 mg/dL   Total CHOL/HDL Ratio 2.3 RATIO   VLDL 26 0 - 40 mg/dL   LDL Cholesterol 64 0 - 99 mg/dL    Comment:        Total  Cholesterol/HDL:CHD Risk Coronary Heart Disease Risk Table                     Men   Women  1/2 Average Risk   3.4   3.3  Average Risk       5.0   4.4  2 X Average Risk   9.6   7.1  3 X Average Risk  23.4   11.0        Use the calculated Patient Ratio above and the CHD Risk Table to determine the patient's CHD Risk.        ATP III CLASSIFICATION (LDL):  <100     mg/dL   Optimal  119-147  mg/dL   Near or Above                    Optimal  130-159  mg/dL   Borderline  829-562  mg/dL   High  >130     mg/dL   Very High Performed at [facility], [address] [place], [place] [address]   RPR     Status: None   Collection Time: 10/06/23  6:41 PM  Result Value Ref Range   RPR Ser Ql NON REACTIVE NON REACTIVE    Comment: Performed at [facility], [address] [place], [place] [address]  TSH     Status: None   Collection Time: 10/06/23  6:41 PM  Result Value Ref Range   TSH 1.144 0.350 - 4.500 uIU/mL    Comment: Performed by a 3rd Generation assay with a functional sensitivity of <=0.01 uIU/mL. Performed at [facility], [address], [place], [place] [address]   Vitamin B12     Status: None   Collection Time: 10/06/23  6:41 PM  Result Value Ref Range   Vitamin B-12 412 180 - 914 pg/mL    Comment: (NOTE) This assay is not validated for testing neonatal or myeloproliferative syndrome specimens for Vitamin B12 levels. Performed at [facility], [address], [place], [place] [address]   VITAMIN D 25 Hydroxy (Vit-D Deficiency, Fractures)     Status: Abnormal   Collection Time: 10/06/23  6:41 PM  Result Value Ref Range   Vit D, 25-Hydroxy 16.11 (L) 30 - 100 ng/mL    Comment: (NOTE) Vitamin D deficiency has been defined by the Institute of Medicine  and an Endocrine Society practice guideline as a level of serum 25-OH  vitamin D less than 20 ng/mL (1,2). The Endocrine Society went on to  further define  vitamin D insufficiency as a level between 21 and 29  ng/mL (2).  1. IOM (Institute of Medicine). 2010. Dietary reference intakes for  calcium and D. [place]: [employer]. 2. Holick MF, Binkley Doon, Bischoff-Ferrari HA, et al. Evaluation,  treatment, and prevention of vitamin D deficiency: an Endocrine  Society clinical practice guideline, JCEM. 2011  Jul; 96(7): 1911-30.  Performed at [facility], [address], [place], [place] [address]     Blood Alcohol level:  Lab Results  Component Value Date   [facility] <10 10/05/2023   ETH <10 10/12/2022    Metabolic Disorder Labs: Lab Results  Component Value Date   HGBA1C 5.5 10/06/2023   MPG 111.15 10/06/2023   No results found for: "PROLACTIN" Lab Results  Component Value Date   CHOL 157 10/06/2023   TRIG 132 10/06/2023   HDL 67 10/06/2023   CHOLHDL 2.3 10/06/2023   VLDL 26 10/06/2023   LDLCALC 64 10/06/2023   LDLCALC 79 10/12/2022    Physical Findings: AIMS:  , ,  ,  ,    CIWA:    COWS:     Musculoskeletal: Strength & Muscle Tone: within normal limits Gait & Station: normal Patient leans: N/A  Psychiatric Specialty Exam:  Presentation  General Appearance:  Disheveled  Eye Contact: Fair  Speech: Clear and Coherent  Speech Volume: Normal  Handedness: Right   Mood and Affect  Mood: Depressed; Anxious  Affect: Congruent   Thought Process  Thought Processes: Coherent  Descriptions of Associations:Intact  Orientation:Full (Time, Place and Person)  Thought Content:Logical  History of Schizophrenia/Schizoaffective disorder:No  Duration of Psychotic Symptoms:No data recorded Hallucinations:Hallucinations: None  Ideas of Reference:None  Suicidal Thoughts:Suicidal Thoughts: Yes, Passive SI Passive Intent and/or Plan: Without Intent  Homicidal Thoughts:Homicidal Thoughts: No   Sensorium  Memory: Immediate  Fair  Judgment: Fair  Insight: Fair   Chartered certified accountant: Fair  Attention Span: Fair  Recall: [employer] of Knowledge: Fair  Language: Fair   Psychomotor Activity  Psychomotor Activity: Psychomotor Activity: Normal   Assets  Assets: Resilience   Sleep  Sleep: Sleep: Fair    Physical Exam: Physical Exam Constitutional:      Appearance: Normal appearance.  HENT:     Head: Normocephalic.  Eyes:     Pupils: Pupils are equal, round, and reactive to light.  Musculoskeletal:        General: Normal range of motion.     Cervical back: Normal range of motion.  Neurological:     Mental Status: He is alert.    Review of Systems  Psychiatric/Behavioral:  Positive for depression, substance abuse and suicidal ideas (passive). Negative for hallucinations and memory loss. The patient is nervous/anxious and has insomnia.   All other systems reviewed and are negative.  Blood pressure (!) 129/90, pulse (!) 52, temperature 97.9 F (36.6 C), temperature source Oral, resp. rate 16, height 5\' 11"  (1.803 m), weight 67.4 kg, SpO2 100%. Body mass index is 20.73 kg/m.  Treatment Plan Summary: Daily contact with patient to assess and evaluate symptoms and progress in treatment and Medication management   Safety and Monitoring: Voluntary admission to inpatient psychiatric unit for safety, stabilization and treatment Daily contact with patient to assess and evaluate symptoms and progress in treatment Patient's case to be discussed in multi-disciplinary team meeting Observation Level : q15 minute checks Vital signs: q12 hours Precautions: Safety   Long Term Goal(s): Improvement in symptoms so as ready for discharge   Short Term Goals: Ability to identify changes in lifestyle to reduce recurrence of condition will improve, Ability to verbalize feelings will improve, Ability to disclose and discuss suicidal ideas, Ability to demonstrate self-control will  improve, Ability to identify and develop effective coping behaviors will improve, Ability to maintain clinical measurements within normal limits will improve, Compliance with prescribed medications  will improve, and Ability to identify triggers associated with substance abuse/mental health issues will improve   Diagnoses Principal Problem:   MDD (major depressive disorder), recurrent severe, without psychosis (HCC) Active Problems:   Nicotine use disorder   Cocaine use disorder (HCC)   Tetrahydrocannabinol (THC) use disorder, mild, abuse   Anxiety   Medications: -Start Vitamin D 50.000 units weekly for low Vitamin D levels  -Discontinued Zoloft on admission -Continue Wellbutrin for management of depressive symptoms as will help with cocaine use d/o as well. -Increase Naltrexone from 25 mg to 50 mg starting 04/02 for cocaine use d/o -Continue nicotine patch daily for nicotine use d/o    PRNS -Continue Trazodone 50 mg nightly PRN for sleep -Continue Hydroxyzine 25 mg TID PRN for anxiety -Continue agitation protocol: Ativan/Benadryl/Haldol PRN-See MAR -Continue Tylenol 650 mg every 6 hours PRN for mild pain -Continue Maalox 30 mg every 4 hrs PRN for indigestion -Continue Milk of Magnesia as needed every 6 hrs for constipation   Educated on rationales, benefits, possible side effects of medications, verbalizes understanding, agreeable to trials.   Discharge Planning: Social work and case management to assist with discharge planning and identification of hospital follow-up needs prior to discharge Estimated LOS: 5-7 days Discharge Concerns: Need to establish a safety plan; Medication compliance and effectiveness Discharge Goals: Return home with outpatient referrals for mental health follow-up including medication management/psychotherapy   I certify that inpatient services furnished can reasonably be expected to improve the patient's condition.    Starleen Blue, NP 10/07/2023, 2:44  PM

## 2023-10-07 NOTE — Group Note (Signed)
 LCSW Group Therapy Note   Group Date: 10/07/2023 Start Time: 1100 End Time: 1200  Participation:  did not attend   Type of Therapy:  Group Therapy  Topic:  Understanding Your Path to Change   Objective:  The goal is to help individuals understand the stages of change, identify where they currently are in the process, and provide actionable next steps to continue moving forward in their journey of change.  Goals: Learn about the six stages of change:  Precontemplation, Contemplation, Preparation, Action, Maintenance, and Relapse Reflect on Current Change Efforts:  Recognize which stage participants are in regarding a personal change. Plan Next Steps for Moving Forward:  Create an action plan based on their current stage of change.  Summary:  In this session, we explored the Stages of Change as a framework to understand the process of change.  We discussed how each stage helps individuals recognize where they are in their personal journey and used the Stages of Change Worksheet for self-reflection. Participants answered questions to better understand their current stage, challenges, and progress. We also emphasized the importance of moving forward, even if setbacks (Relapse) occur, and created actionable steps to help participants continue progressing. By the end of the session, participants gained a clearer understanding of their path to change and left with a clear plan for next steps.  Therapeutic Modalities:  Elements of CBT (cognitive restructuring, problem solving)  Element of DBT (mindfulness, distress tolerance)   Aletheia Tangredi O Newel Oien, LCSWA 10/07/2023  6:17 PM

## 2023-10-07 NOTE — Progress Notes (Signed)
   10/07/23 0600  15 Minute Checks  Location Bedroom  Visual Appearance Calm  Behavior Composed  Sleep (Behavioral Health Patients Only)  Calculate sleep? (Click Yes once per 24 hr at 0600 safety check) Yes  Documented sleep last 24 hours 7.5

## 2023-10-07 NOTE — Plan of Care (Signed)
   Problem: Education: Goal: Emotional status will improve Outcome: Progressing Goal: Mental status will improve Outcome: Progressing   Problem: Activity: Goal: Interest or engagement in activities will improve Outcome: Progressing Goal: Sleeping patterns will improve Outcome: Progressing   Problem: Coping: Goal: Ability to demonstrate self-control will improve Outcome: Progressing

## 2023-10-07 NOTE — Progress Notes (Signed)
 Patient reports being worried and concerned about losing job. He rates anxiety 5/10 and depression 5/10  10/07/23 0800  Psych Admission Type (Psych Patients Only)  Admission Status Voluntary  Psychosocial Assessment  Patient Complaints Anxiety;Depression  Eye Contact Fair  Facial Expression Other (Comment) (appropriate)  Affect Appropriate to circumstance  Speech Logical/coherent  Interaction Assertive  Motor Activity Other (Comment) (WNL)  Appearance/Hygiene Unremarkable  Behavior Characteristics Appropriate to situation  Mood Anxious;Depressed  Thought Process  Coherency WDL  Content WDL  Delusions None reported or observed  Perception WDL  Hallucination None reported or observed  Judgment Impaired  Confusion Mild  Danger to Self  Current suicidal ideation? Denies  Agreement Not to Harm Self Yes  Description of Agreement verbal  Danger to Others  Danger to Others None reported or observed

## 2023-10-07 NOTE — [facility] Suicide Risk Assessment (Signed)
 [facility] INPATIENT:  Family/Significant Other Suicide Prevention Education  Suicide Prevention Education:  Patient Refusal for Family/Significant Other Suicide Prevention Education: The patient Philip Collins has refused to provide written consent for family/significant other to be provided Family/Significant Other Suicide Prevention Education during admission and/or prior to discharge.  Physician notified.  Kathi Der 10/07/2023, 3:49 PM

## 2023-10-07 NOTE — Plan of Care (Signed)
   Problem: Education: Goal: Emotional status will improve Outcome: Progressing Goal: Verbalization of understanding the information provided will improve Outcome: Progressing   Problem: Activity: Goal: Sleeping patterns will improve Outcome: Progressing   Problem: Coping: Goal: Ability to demonstrate self-control will improve Outcome: Progressing   Problem: Health Behavior/Discharge Planning: Goal: Compliance with treatment plan for underlying cause of condition will improve Outcome: Progressing   Problem: Safety: Goal: Periods of time without injury will increase Outcome: Progressing

## 2023-10-07 NOTE — Progress Notes (Signed)
  Rosary Lively   Type of Note: [facility]  [facility] was faxed on this morning.  Received call from [place] with admission who reports patient is denied from their facility. Pt has [facility].   MD aware.  Signed:  Brazen Domangue, LCSW-A 10/07/2023  1:06 PM

## 2023-10-07 NOTE — Progress Notes (Addendum)
   10/07/23 1955  Psych Admission Type (Psych Patients Only)  Admission Status Voluntary  Psychosocial Assessment  Patient Complaints Anxiety;Depression  Eye Contact Fair  Facial Expression Anxious  Affect Appropriate to circumstance  Speech Logical/coherent  Interaction Assertive  Motor Activity Other (Comment) (wnl)  Appearance/Hygiene Unremarkable  Behavior Characteristics Cooperative;Appropriate to situation  Mood Anxious;Depressed;Pleasant  Thought Process  Coherency WDL  Content WDL  Delusions None reported or observed  Perception WDL  Hallucination None reported or observed  Judgment WDL  Confusion None  Danger to Self  Current suicidal ideation? Denies  Agreement Not to Harm Self Yes  Description of Agreement verbal  Danger to Others  Danger to Others None reported or observed   Progress note   D: Pt seen in his room talking to his roommate. Pt denies SI, HI, AVH. Contracts for safety.  Pt rates pain  4/10 as a tension headache brought on by anxiety. Pt rates anxiety  7/10 and depression  7/10. Found out today that he cannot attend [facility] because his insurance will not pay for it. "My thoughts have been racing about what I'm gonna do now. They gave me information about shelters and the [facility] but I know if I go back on the street, I will relapse. I can return to the [facility] but not for 14 days. I have to work and I need to be near work." Pt encouraged to reach out to SW for more resources. Discussed with pt what his needs are and what really frustrates him. Much of his frustration is d/t his primary job where he feels disrespected and is under a lot of stress. "I think I need to quit that job. The stress is not good for my mental health." Pt may have some avenue for more hours at his secondary job, which he enjoys more. Pt attended group this evening. No other concerns noted at this time.  A: Pt provided support and encouragement. Pt given scheduled medication as  prescribed. PRNs as appropriate. Q15 min checks for safety.   R: Pt safe on the unit. Will continue to monitor.

## 2023-10-07 NOTE — Plan of Care (Signed)
  Problem: Education: Goal: Mental status will improve Outcome: Progressing   Problem: Activity: Goal: Interest or engagement in activities will improve Outcome: Progressing   Problem: Coping: Goal: Ability to verbalize frustrations and anger appropriately will improve Outcome: Progressing

## 2023-10-07 NOTE — Group Note (Signed)
 Recreation Therapy Group Note   Group Topic:Animal Assisted Therapy   Group Date: 10/07/2023 Start Time: 0950 End Time: 1030 Facilitators: Azaylea Maves-McCall, LRT,CTRS Location: [address] Dayroom   Animal-Assisted Activity (AAA) Program Checklist/Progress Notes Patient Eligibility Criteria Checklist & Daily Group note for Rec Tx Intervention  AAA/T Program Assumption of Risk Form signed by Patient/ or Parent Legal Guardian Yes  Patient understands his/her participation is voluntary Yes  Education: Charity fundraiser, Appropriate Animal Interaction   Education Outcome: Acknowledges education.    Affect/Mood: N/A   Participation Level: Did not attend    Clinical Observations/Individualized Feedback:      Plan: Continue to engage patient in RT group sessions 2-3x/week.   Stefania Goulart-McCall, LRT,CTRS 10/07/2023 1:30 PM

## 2023-10-07 NOTE — Group Note (Signed)
 Date:  10/07/2023 Time:  4:19 PM  Group Topic/Focus:  MHA Group/ Coping Skills    Participation Level:  Active  Participation Quality:  Appropriate  Affect:  Appropriate  Cognitive:  Appropriate  Insight: Appropriate  Engagement in Group:  Engaged  Modes of Intervention:  Education and Support  Additional Comments:   Pt attended and participated in the [facility] /Coping Skills group.  Philip Collins 10/07/2023, 4:19 PM

## 2023-10-07 NOTE — Group Note (Signed)
 Date:  10/07/2023 Time:  10:28 PM  Group Topic/Focus:  Wrap-Up Group:   The focus of this group is to help patients review their daily goal of treatment and discuss progress on daily workbooks.    Participation Level:  Active  Participation Quality:  Appropriate  Affect:  Appropriate  Cognitive:  Appropriate  Insight: Appropriate  Engagement in Group:  Developing/Improving  Modes of Intervention:  Discussion  Additional Comments:  Pt stated his goal for today was to focus on his treatment plan. Pt stated he accomplished his goal today. Pt stated he talked with his doctor and social worker about his care today. Pt rated his overall day a 5 out of 10. Pt stated he made no calls today. Pt stated he felt better about himself today. Pt stated he was able to attend all meals. Pt stated he took all medications provided today. Pt stated he attend all groups held today. Pt stated his appetite was pretty good today. Pt rated sleep last night was pretty good. Pt stated the goal tonight was to get some rest. Pt stated he had no physical pain tonight. Pt deny visual hallucinations and auditory issues tonight. Pt denies thoughts of harming himself or others. Pt stated he would alert staff if anything changed  Felipa Furnace 10/07/2023, 10:28 PM

## 2023-10-07 NOTE — Group Note (Signed)
 Date:  10/07/2023 Time:  11:32 AM  Group Topic/Focus:  Goals Group:   The focus of this group is to help patients establish daily goals to achieve during treatment and discuss how the patient can incorporate goal setting into their daily lives to aide in recovery. Orientation:   The focus of this group is to educate the patient on the purpose and policies of crisis stabilization and provide a format to answer questions about their admission.  The group details unit policies and expectations of patients while admitted.    Participation Level:  Active  Participation Quality:  Appropriate  Affect:  Appropriate  Cognitive:  Appropriate  Insight: Appropriate  Engagement in Group:  Engaged  Modes of Intervention:  Activity and Orientation  Additional Comments:   Pt attended and actively participated in the Orientation and Goals group.  Philip Collins 10/07/2023, 11:32 AM

## 2023-10-08 DIAGNOSIS — F332 Major depressive disorder, recurrent severe without psychotic features: Secondary | ICD-10-CM

## 2023-10-08 NOTE — [facility] Group Notes (Signed)
Patient attended the NA group. ?

## 2023-10-08 NOTE — [facility] Group Notes (Signed)
 Spirituality Group   Goal: This group is driven by participant needs and themes that emerge from current concerns.   Chaplain offers ground rules and basic framework for engagement, naming the ways that emotions, sense of self and values, grief, and relationship among other concerns all comprise spiritual life.   Theoretical basis: Using the group therapy frameworks of Chyrl Civatte as well as principles in Relational Cultural Therapy as well as Rogerian approaches, participants are invited to explore spiritual needs and to respond in ways that foster mutual empathy and relational support.   Observations: Philip Collins was an actively engaged participant in the group discussion.   [name] L. [name], M.Div [phone]

## 2023-10-08 NOTE — Plan of Care (Signed)
   Problem: Activity: Goal: Interest or engagement in activities will improve Outcome: Progressing Goal: Sleeping patterns will improve Outcome: Progressing   Problem: Coping: Goal: Ability to verbalize frustrations and anger appropriately will improve Outcome: Progressing Goal: Ability to demonstrate self-control will improve Outcome: Progressing   Problem: Safety: Goal: Periods of time without injury will increase Outcome: Progressing   Problem: Physical Regulation: Goal: Ability to maintain clinical measurements within normal limits will improve Outcome: Progressing

## 2023-10-08 NOTE — Group Note (Signed)
 Date:  10/08/2023 Time:  10:28 AM  Group Topic/Focus:  Goals Group:   The focus of this group is to help patients establish daily goals to achieve during treatment and discuss how the patient can incorporate goal setting into their daily lives to aide in recovery. Orientation:   The focus of this group is to educate the patient on the purpose and policies of crisis stabilization and provide a format to answer questions about their admission.  The group details unit policies and expectations of patients while admitted.    Participation Level:  Active  Participation Quality:  Appropriate  Affect:  Appropriate  Cognitive:  Appropriate  Insight: Appropriate  Engagement in Group:  Engaged  Modes of Intervention:  Discussion and Orientation  Additional Comments:   Pt attended and participated in the Orientation and Goals group.  Edmund Hilda Alaynah Schutter 10/08/2023, 10:28 AM

## 2023-10-08 NOTE — Progress Notes (Addendum)
   10/08/23 2000  Psych Admission Type (Psych Patients Only)  Admission Status Voluntary  Psychosocial Assessment  Patient Complaints Anxiety;Depression;Worrying  Eye Contact Fair  Facial Expression Anxious  Affect Anxious  Speech Logical/coherent  Interaction Assertive  Motor Activity Other (Comment) (wnl)  Appearance/Hygiene Unremarkable  Behavior Characteristics Cooperative;Appropriate to situation;Anxious  Mood Anxious;Depressed;Pleasant  Thought Process  Coherency WDL  Content WDL;Blaming self  Delusions None reported or observed  Perception WDL  Hallucination None reported or observed  Judgment WDL  Confusion None  Danger to Self  Current suicidal ideation? Denies  Agreement Not to Harm Self Yes  Description of Agreement verbal  Danger to Others  Danger to Others None reported or observed   Progress note   D: Pt seen in his room talking to his roommate. Pt denies SI, HI, AVH. Pt rates pain  0/10. Pt rates anxiety  9/10 and endorses a lot of depression r/t finding out that his insurance wants him discharged right away. "I'm worried because I don't have a place to stay and I can't return to the [facility] for 14 days. I have no one I can call to stay with. The one person I called said no. My sister told me not to even call her." Pt asked about other options for housing in the interim. "I did have a thought today. I have [employer] and I think I remember that they had some type of transitional housing assistance in [place]. I'm gonna have to talk to the social worker about it." Pt encouraged to do so. 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 He said he would contact his peer support specialist to find out how he can get his things before he is officially allowed to return to the house. Pt has been attending groups and seen in the milieu. No other concerns noted at this time.  A: Pt provided support and encouragement. Pt given  scheduled medication as prescribed. PRNs as appropriate. Q15 min checks for safety.   R: Pt safe on the unit. Will continue to monitor.

## 2023-10-08 NOTE — Progress Notes (Signed)
   10/08/23 0555  15 Minute Checks  Location Bedroom  Visual Appearance Calm  Behavior Sleeping  Sleep (Behavioral Health Patients Only)  Calculate sleep? (Click Yes once per 24 hr at 0600 safety check) Yes  Documented sleep last 24 hours 7.75

## 2023-10-08 NOTE — Group Note (Signed)
 Recreation Therapy Group Note   Group Topic:Communication  Group Date: 10/08/2023 Start Time: 0934 End Time: 1000 Facilitators: Citlalic Norlander-McCall, LRT,CTRS Location: [address] Dayroom   Group Topic: Communication, Team Building, Problem Solving  Goal Area(s) Addresses:  Patient will effectively work with peer towards shared goal.  Patient will identify skills used to make activity successful.  Patient will identify how skills used during activity can be used to reach post d/c goals.   Intervention: STEM Activity  Activity: Straw Bridge. In teams of 3-5, patients were given 15 plastic drinking straws and an equal length of masking tape. Using the materials provided, patients were instructed to build a free standing bridge-like structure to suspend an everyday item (ex: puzzle box) off of the floor or table surface. All materials were required to be used by the team in their design. LRT facilitated post-activity discussion reviewing team process. Patients were encouraged to reflect how the skills used in this activity can be generalized to daily life post discharge.   Education: Pharmacist, community, Scientist, physiological, Discharge Planning   Education Outcome: Acknowledges education/In group clarification offered/Needs additional education.    Affect/Mood: Appropriate   Participation Level: Engaged   Participation Quality: Independent   Behavior: Appropriate   Speech/Thought Process: Focused   Insight: Good   Judgement: Good   Modes of Intervention: STEM Activity   Patient Response to Interventions:  Engaged   Education Outcome:  In group clarification offered    Clinical Observations/Individualized Feedback: Pt was bright and focused. Pt listened to the suggestions of peers while also offering suggestions of his own. Pt also helped construct the structure to the liking of each member of the group.    Plan: Continue to engage patient in RT group sessions  2-3x/week.   Nyomie Ehrlich-McCall, LRT,CTRS 10/08/2023 12:41 PM

## 2023-10-08 NOTE — Progress Notes (Cosign Needed Addendum)
 [facility] MD Progress Note  10/08/2023 4:55 PM Philip Collins  MRN:  [id number]  Principal Problem: MDD (major depressive disorder), recurrent severe, without psychosis (HCC) Diagnosis: Principal Problem:   MDD (major depressive disorder), recurrent severe, without psychosis (HCC) Active Problems:   Nicotine use disorder   Cocaine use disorder (HCC)   Tetrahydrocannabinol (THC) use disorder, mild, abuse   Anxiety  HPI: Philip Collins is a 58 y.o.AA male with a prior history of cocaine use d/o who presented to the [facility] behavioral health center on 03/29 with complaints of depressive symptoms & psychosocial stressors status post relapsing on crack cocaine, and being kicked out of an [place] house.  Patient was initially discharged, refused to leave the lobby, & as per [facility] documentation: "Pt never left the lobby after he was discharged and as per [facility] reports "feels like he is a danger to himself" to registration."  He was reassessed by provider, and presented with passive SI. Per provider at [facility]:  "Patient is stating that he does not know what he may do to himself if he is discharged. Patient has some passive suicidal ideation with no plan or intent." (Bobbitt, Franchot Mimes, NP, Date of Service: 10/05/2023 12:51 AM).  24 hr chart review: Prior to encounter with pt, chart was reviewed, case discussed with treatment team.  V/S WNL. Trazodone & Hydroxyzine are the PRN meds that were given overnight for sleep and anxiety respectively. Slept for 7.5 hrs as per nursing documentation. No behavioral concerns in the past 25 hrs.  Today's patient assessment note: On assessment today, depression is persistent, patient presents with passive SI, he continues to report wanting to go to sleep and not waking up the following day, continuing to state that his life has no purpose.  Stressor is psychosocial in nature, primarily the lack of stable housing x 14 days, until he returns to his [place] house.  He states  that his life is useless, he states that he feels as though "I am just here to get by", states "I am just existing".  He rates depression of 5, 10 is worst, rates anxiety a 6, 10 is worse.  States that his concentration level is "terrible".  Rates energy as being good, reports a good appetite, states that his sleep quality is good.He denies HI, denies AVH, denies paranoia, denies delusional thinking and there are no overt signs of psychosis.  Writer explored the option of patient going to reside with his sister and [place] until he returns to his [place] house in 14 days, and medically to him that he would not be able to reside at this facility for 14 days.  Patient stated that he does not get along with his sister's husband, and therefore will not be able to go there.  Writer inquired about other family members, patient stated that he had none.  We talked about the various shelters in this area, patient stated that he did not want to [facility] to [facility] due to the drug activities in that area.  We talked about exploring shelter options outside of [place], patient was resistant, stating that he would not have transportation to return to this area.  Writer informed patient that the plan was in place for him to be discharged tomorrow 4/3, and for him to see if he can make alternative arrangements on   Where to go. Tentative plan for discharge is therefore for tomorrow, 4/3. Continuing all meds as listed below.  Labs reviewed: no new labs ordered.  Total Time spent with patient: 45 minutes  Past Psychiatric History: See H & P  Past Medical History:  Past Medical History:  Diagnosis Date   BPH with obstruction/lower urinary tract symptoms    Bradycardia 01/15/2022   Broken tooth 01/15/2022   Erectile dysfunction    Incomplete bladder emptying 01/15/2022   Lump of left breast 02/13/2022   Prediabetes 02/13/2022   Substance abuse (HCC)    drug    Past Surgical History:  Procedure Laterality  Date   BUNIONECTOMY Bilateral    ~30 years ago   COLONOSCOPY WITH PROPOFOL N/A 03/20/2022   Procedure: COLONOSCOPY WITH PROPOFOL;  Surgeon: Wyline Mood, MD;  Location: [facility] ENDOSCOPY;  Service: Gastroenterology;  Laterality: N/A;   KNEE ARTHROSCOPY Left    ~when patient was [age]   Family History:  Family History  Problem Relation Age of Onset   Hypertension Mother    Diabetes Mother    Kidney failure Mother    Other Father        "gum disease"   Hypertension Sister    Hypertension Sister    Other Maternal Grandmother        unknown medical history   Dementia Maternal Grandfather    Other Paternal Grandmother        unknown medical history   Other Paternal Grandfather        unknown medical history   Prostate cancer Neg Hx    Bladder Cancer Neg Hx    Kidney cancer Neg Hx    Breast cancer Neg Hx    Family Psychiatric  History: See H & P Social History:  Social History   Substance and Sexual Activity  Alcohol Use Not Currently   Comment: occassional, last use 10/09/21     Social History   Substance and Sexual Activity  Drug Use Not Currently   Types: Marijuana, Cocaine, "Crack" cocaine, Methamphetamines   Comment: last use 10/09/21    Social History   Socioeconomic History   Marital status: Divorced    Spouse name: Not on file   Number of children: 4   Years of education: Not on file   Highest education level: Not on file  Occupational History   Not on file  Tobacco Use   Smoking status: Every Day    Current packs/day: 0.50    Average packs/day: 0.5 packs/day for 37.0 years (18.5 ttl pk-yrs)    Types: Cigarettes    Passive exposure: Current   Smokeless tobacco: Never   Tobacco comments:    Patient is at [facility] since 10/10/21, previous to that he was homeless    Patient trying to quit smoking. Patient states he has bought his last pack  Vaping Use   Vaping status: Never Used  Substance and Sexual Activity   Alcohol use: Not Currently    Comment: occassional,  last use 10/09/21   Drug use: Not Currently    Types: Marijuana, Cocaine, "Crack" cocaine, Methamphetamines    Comment: last use 10/09/21   Sexual activity: Not Currently  Other Topics Concern   Not on file  Social History Narrative   Lives sober living of [place].    Social Drivers of Corporate investment banker Strain: Not on file  Food Insecurity: Food Insecurity Present (10/05/2023)   Hunger Vital Sign    Worried About Running Out of Food in the Last Year: Often true    Ran Out of Food in the Last Year: Often true  Transportation Needs: Unmet Transportation Needs (  10/05/2023)   PRAPARE - Administrator, Civil Service (Medical): Yes    Lack of Transportation (Non-Medical): Yes  Physical Activity: Not on file  Stress: Not on file  Social Connections: Not on file   Sleep: Good  Appetite:  Fair  Current Medications: Current Facility-Administered Medications  Medication Dose Route Frequency Provider Last Rate Last Admin   acetaminophen (TYLENOL) tablet 650 mg  650 mg Oral Q6H PRN McLauchlin, Marylene Land, NP   650 mg at 10/07/23 2110   alum & mag hydroxide-simeth (MAALOX/MYLANTA) 200-200-20 MG/5ML suspension 30 mL  30 mL Oral Q4H PRN McLauchlin, Angela, NP       buPROPion (WELLBUTRIN XL) 24 hr tablet 150 mg  150 mg Oral Daily Starleen Blue, NP   150 mg at 10/08/23 0757   haloperidol (HALDOL) tablet 5 mg  5 mg Oral TID PRN McLauchlin, Marylene Land, NP       And   diphenhydrAMINE (BENADRYL) capsule 50 mg  50 mg Oral TID PRN McLauchlin, Marylene Land, NP       haloperidol lactate (HALDOL) injection 5 mg  5 mg Intramuscular TID PRN McLauchlin, Marylene Land, NP       And   diphenhydrAMINE (BENADRYL) injection 50 mg  50 mg Intramuscular TID PRN McLauchlin, Marylene Land, NP       And   LORazepam (ATIVAN) injection 2 mg  2 mg Intramuscular TID PRN McLauchlin, Angela, NP       haloperidol lactate (HALDOL) injection 10 mg  10 mg Intramuscular TID PRN McLauchlin, Angela, NP       And   diphenhydrAMINE  (BENADRYL) injection 50 mg  50 mg Intramuscular TID PRN McLauchlin, Angela, NP       And   LORazepam (ATIVAN) injection 2 mg  2 mg Intramuscular TID PRN McLauchlin, Angela, NP       feeding supplement (ENSURE ENLIVE / ENSURE PLUS) liquid 237 mL  237 mL Oral TID BM Lou Loewe, NP   237 mL at 10/08/23 1405   hydrOXYzine (ATARAX) tablet 25 mg  25 mg Oral TID PRN McLauchlin, Marylene Land, NP   25 mg at 10/07/23 2111   magnesium hydroxide (MILK OF MAGNESIA) suspension 30 mL  30 mL Oral Daily PRN McLauchlin, Angela, NP       naltrexone (DEPADE) tablet 50 mg  50 mg Oral Daily Krislynn Gronau, NP   50 mg at 10/08/23 0757   traZODone (DESYREL) tablet 50 mg  50 mg Oral QHS PRN Starleen Blue, NP   50 mg at 10/07/23 2111   Vitamin D (Ergocalciferol) (DRISDOL) 1.25 MG (50000 UNIT) capsule 50,000 Units  50,000 Units Oral Q7 days Golda Acre, MD   50,000 Units at 10/07/23 1610   vitamin D3 (CHOLECALCIFEROL) tablet 2,000 Units  2,000 Units Oral BID Golda Acre, MD   2,000 Units at 10/08/23 9604    Lab Results:  Results for orders placed or performed during the hospital encounter of 10/05/23 (from the past 48 hours)  Folate     Status: None   Collection Time: 10/06/23  6:41 PM  Result Value Ref Range   Folate 9.0 >5.9 ng/mL    Comment: Performed at [facility], [address], [place], [place] [address]  Hemoglobin A1c     Status: None   Collection Time: 10/06/23  6:41 PM  Result Value Ref Range   Hgb A1c MFr Bld 5.5 4.8 - 5.6 %    Comment: (NOTE) Pre diabetes:  5.7%-6.4%  Diabetes:              >6.4%  Glycemic control for   <7.0% adults with diabetes    Mean Plasma Glucose 111.15 mg/dL    Comment: Performed at [facility], [address], [place], [place] [address]  Lipid panel     Status: None   Collection Time: 10/06/23  6:41 PM  Result Value Ref Range   Cholesterol 157 0 - 200 mg/dL   Triglycerides 604 <540 mg/dL   HDL 67 >98 mg/dL   Total CHOL/HDL  Ratio 2.3 RATIO   VLDL 26 0 - 40 mg/dL   LDL Cholesterol 64 0 - 99 mg/dL    Comment:        Total Cholesterol/HDL:CHD Risk Coronary Heart Disease Risk Table                     Men   Women  1/2 Average Risk   3.4   3.3  Average Risk       5.0   4.4  2 X Average Risk   9.6   7.1  3 X Average Risk  23.4   11.0        Use the calculated Patient Ratio above and the CHD Risk Table to determine the patient's CHD Risk.        ATP III CLASSIFICATION (LDL):  <100     mg/dL   Optimal  119-147  mg/dL   Near or Above                    Optimal  130-159  mg/dL   Borderline  829-562  mg/dL   High  >130     mg/dL   Very High Performed at [facility], [address], [place], [place] [address]   RPR     Status: None   Collection Time: 10/06/23  6:41 PM  Result Value Ref Range   RPR Ser Ql NON REACTIVE NON REACTIVE    Comment: Performed at [facility], [address] [place], [place] [address]  TSH     Status: None   Collection Time: 10/06/23  6:41 PM  Result Value Ref Range   TSH 1.144 0.350 - 4.500 uIU/mL    Comment: Performed by a 3rd Generation assay with a functional sensitivity of <=0.01 uIU/mL. Performed at [facility], [address], [place], [place] [address]   Vitamin B12     Status: None   Collection Time: 10/06/23  6:41 PM  Result Value Ref Range   Vitamin B-12 412 180 - 914 pg/mL    Comment: (NOTE) This assay is not validated for testing neonatal or myeloproliferative syndrome specimens for Vitamin B12 levels. Performed at [facility], [address], [place], [place] [address]   VITAMIN D 25 Hydroxy (Vit-D Deficiency, Fractures)     Status: Abnormal   Collection Time: 10/06/23  6:41 PM  Result Value Ref Range   Vit D, 25-Hydroxy 16.11 (L) 30 - 100 ng/mL    Comment: (NOTE) Vitamin D deficiency has been defined by the Institute of Medicine  and an Endocrine Society practice guideline as a level of  serum 25-OH  vitamin D less than 20 ng/mL (1,2). The Endocrine Society went on to  further define vitamin D insufficiency as a level between 21 and 29  ng/mL (2).  1. IOM (Institute of Medicine). 2010. Dietary reference intakes  for  calcium and D. [place]: [employer]. 2. Holick MF, Binkley Burgoon, Bischoff-Ferrari HA, et al. Evaluation,  treatment, and prevention of vitamin D deficiency: an Endocrine  Society clinical practice guideline, JCEM. 2011 Jul; 96(7): 1911-30.  Performed at [facility], [address] [place], [place] [address]     Blood Alcohol level:  Lab Results  Component Value Date   [facility] <10 10/05/2023   ETH <10 10/12/2022    Metabolic Disorder Labs: Lab Results  Component Value Date   HGBA1C 5.5 10/06/2023   MPG 111.15 10/06/2023   No results found for: "PROLACTIN" Lab Results  Component Value Date   CHOL 157 10/06/2023   TRIG 132 10/06/2023   HDL 67 10/06/2023   CHOLHDL 2.3 10/06/2023   VLDL 26 10/06/2023   LDLCALC 64 10/06/2023   LDLCALC 79 10/12/2022    Physical Findings: AIMS:  , ,  ,  ,    CIWA:    COWS:     Musculoskeletal: Strength & Muscle Tone: within normal limits Gait & Station: normal Patient leans: N/A  Psychiatric Specialty Exam:  Presentation  General Appearance:  Appropriate for Environment  Eye Contact: Fair  Speech: Clear and Coherent  Speech Volume: Normal  Handedness: Right   Mood and Affect  Mood: Depressed; Anxious  Affect: Congruent   Thought Process  Thought Processes: Coherent  Descriptions of Associations:Intact  Orientation:Full (Time, Place and Person)  Thought Content:Logical  History of Schizophrenia/Schizoaffective disorder:No  Duration of Psychotic Symptoms:No data recorded Hallucinations:Hallucinations: None  Ideas of Reference:None  Suicidal Thoughts:Suicidal Thoughts: Yes, Passive SI Passive Intent and/or Plan: Without Intent; Without  Plan  Homicidal Thoughts:Homicidal Thoughts: No   Sensorium  Memory: Immediate Fair  Judgment: Fair  Insight: Fair   Chartered certified accountant: Fair  Attention Span: Fair  Recall: [employer] of Knowledge: Fair  Language: Fair   Psychomotor Activity  Psychomotor Activity: Psychomotor Activity: Normal   Assets  Assets: Resilience   Sleep  Sleep: Sleep: Good    Physical Exam: Physical Exam Constitutional:      Appearance: Normal appearance.  HENT:     Head: Normocephalic.  Eyes:     Pupils: Pupils are equal, round, and reactive to light.  Musculoskeletal:        General: Normal range of motion.     Cervical back: Normal range of motion.  Neurological:     Mental Status: He is alert.    Review of Systems  Psychiatric/Behavioral:  Positive for depression, substance abuse and suicidal ideas (passive). Negative for hallucinations and memory loss. The patient is nervous/anxious and has insomnia.   All other systems reviewed and are negative.  Blood pressure 133/86, pulse 77, temperature 98.4 F (36.9 C), temperature source Oral, resp. rate 16, height 5\' 11"  (1.803 m), weight 67.4 kg, SpO2 100%. Body mass index is 20.73 kg/m.  Treatment Plan Summary: Daily contact with patient to assess and evaluate symptoms and progress in treatment and Medication management   Safety and Monitoring: Voluntary admission to inpatient psychiatric unit for safety, stabilization and treatment Daily contact with patient to assess and evaluate symptoms and progress in treatment Patient's case to be discussed in multi-disciplinary team meeting Observation Level : q15 minute checks Vital signs: q12 hours Precautions: Safety   Long Term Goal(s): Improvement in symptoms so as ready for discharge   Short Term Goals: Ability to identify changes in lifestyle to reduce recurrence of condition will improve, Ability to verbalize feelings  will improve, Ability to  disclose and discuss suicidal ideas, Ability to demonstrate self-control will improve, Ability to identify and develop effective coping behaviors will improve, Ability to maintain clinical measurements within normal limits will improve, Compliance with prescribed medications will improve, and Ability to identify triggers associated with substance abuse/mental health issues will improve   Diagnoses Principal Problem:   MDD (major depressive disorder), recurrent severe, without psychosis (HCC) Active Problems:   Nicotine use disorder   Cocaine use disorder (HCC)   Tetrahydrocannabinol (THC) use disorder, mild, abuse   Anxiety   Medications: -Continue Vitamin D 50.000 units weekly for low Vitamin D levels  -Discontinued Zoloft on admission -Continue Wellbutrin for management of depressive symptoms as will help with cocaine use d/o as well. -Continue Naltrexone 50 mg for cocaine use d/o -Continue nicotine patch daily for nicotine use d/o    PRNS -Continue Trazodone 50 mg nightly PRN for sleep -Continue Hydroxyzine 25 mg TID PRN for anxiety -Continue agitation protocol: Ativan/Benadryl/Haldol PRN-See MAR -Continue Tylenol 650 mg every 6 hours PRN for mild pain -Continue Maalox 30 mg every 4 hrs PRN for indigestion -Continue Milk of Magnesia as needed every 6 hrs for constipation   Educated on rationales, benefits, possible side effects of medications, verbalizes understanding, agreeable to trials.   Discharge Planning: Social work and case management to assist with discharge planning and identification of hospital follow-up needs prior to discharge Estimated LOS: 5-7 days Discharge Concerns: Need to establish a safety plan; Medication compliance and effectiveness Discharge Goals: Return home with outpatient referrals for mental health follow-up including medication management/psychotherapy   I certify that inpatient services furnished can reasonably be expected to improve the patient's  condition.    Starleen Blue, NP 10/08/2023, 4:55 PM Patient ID: Tajae Rybicki, male   DOB: 09/26/1965, 58 y.o.   MRN: [id number]

## 2023-10-08 NOTE — Plan of Care (Signed)
   Problem: Education: Goal: Emotional status will improve Outcome: Progressing Goal: Mental status will improve Outcome: Progressing   Problem: Activity: Goal: Interest or engagement in activities will improve Outcome: Progressing Goal: Sleeping patterns will improve Outcome: Progressing   Problem: Coping: Goal: Ability to verbalize frustrations and anger appropriately will improve Outcome: Progressing Goal: Ability to demonstrate self-control will improve Outcome: Progressing   Problem: Safety: Goal: Periods of time without injury will increase Outcome: Progressing

## 2023-10-08 NOTE — Progress Notes (Signed)
 Patient rated his depression level 5/10 and his anxiety level 6/10 with 10 being the highest and 0 none. Medication and group compliant. Pt observed interacting well with some peers. Appetite good on shift. Safety maintained.  10/08/23 0900  Psych Admission Type (Psych Patients Only)  Admission Status Voluntary  Psychosocial Assessment  Patient Complaints Anxiety;Depression  Eye Contact Fair  Facial Expression Anxious  Affect Appropriate to circumstance  Speech Logical/coherent  Interaction Assertive  Motor Activity Other (Comment) (WNL)  Appearance/Hygiene Unremarkable  Behavior Characteristics Cooperative;Appropriate to situation  Mood Anxious;Depressed;Pleasant  Thought Process  Coherency WDL  Content WDL  Delusions None reported or observed  Perception WDL  Hallucination None reported or observed  Judgment WDL  Confusion None  Danger to Self  Current suicidal ideation? Denies  Agreement Not to Harm Self Yes  Description of Agreement Verbal  Danger to Others  Danger to Others None reported or observed

## 2023-10-09 DIAGNOSIS — F332 Major depressive disorder, recurrent severe without psychotic features: Secondary | ICD-10-CM | POA: Diagnosis not present

## 2023-10-09 MED ORDER — ESCITALOPRAM OXALATE 10 MG PO TABS
10.0000 mg | ORAL_TABLET | Freq: Every day | ORAL | Status: DC
  Administered 2023-10-09 – 2023-10-10 (×2): 10 mg via ORAL
  Filled 2023-10-09 (×5): qty 1

## 2023-10-09 NOTE — [facility] Group Notes (Signed)
 [facility] Group Notes:  (Nursing/MHT/Case Management/Adjunct)  Date:  10/09/2023  Time: 2000  Type of Therapy:   Wrap up group  Participation Level:  Active  Participation Quality:  Appropriate, Attentive, Sharing, and Supportive  Affect:  Appropriate  Cognitive:  Alert  Insight:  Improving  Engagement in Group:  Engaged  Modes of Intervention:  Clarification, Education, and Support  Summary of Progress/Problems: Positive thinking and positive change were discussed.   Marcille Buffy 10/09/2023, 9:47 PM

## 2023-10-09 NOTE — Plan of Care (Signed)
   Problem: Education: Goal: Mental status will improve Outcome: Progressing   Problem: Activity: Goal: Interest or engagement in activities will improve Outcome: Progressing

## 2023-10-09 NOTE — Progress Notes (Addendum)
 D. Pt has been calm and cooperative- visible in the milieu, observed interacting well with peers and staff, and attending groups. Pt reported sleeping well last night, described his appetite as 'good', concentration as 'poor', and energy level as 'low'. Per pt's self inventory, pt rated his depression,hopelessness and anxiety a 5/4/7, respectively. Pt's stated goal today is "communicating with my peer support in preparation to my discharge. And to not procrastinate." Pt currently denies SI/HI and AVH A. Labs and vitals monitored. Pt given and educated on medications. Pt supported emotionally and encouraged to express concerns and ask questions.   R. Pt remains safe with 15 minute checks. Will continue POC.    10/09/23 1600  Psych Admission Type (Psych Patients Only)  Admission Status Voluntary  Psychosocial Assessment  Patient Complaints Anxiety  Eye Contact Fair  Facial Expression Anxious  Affect Appropriate to circumstance  Speech Logical/coherent  Interaction Assertive  Motor Activity Other (Comment) (steady gait)  Appearance/Hygiene Unremarkable  Behavior Characteristics Cooperative;Appropriate to situation  Mood Pleasant;Anxious  Thought Process  Coherency WDL  Content WDL  Delusions None reported or observed  Perception WDL  Hallucination None reported or observed  Judgment WDL  Confusion None  Danger to Self  Current suicidal ideation? Denies  Danger to Others  Danger to Others None reported or observed

## 2023-10-09 NOTE — Group Note (Signed)
 Date:  10/09/2023 Time:  8:39 AM  Group Topic/Focus:  Goals Group:   The focus of this group is to help patients establish daily goals to achieve during treatment and discuss how the patient can incorporate goal setting into their daily lives to aide in recovery.    Participation Level:  Did Not Attend   Erasmo Score 10/09/2023, 8:39 AM

## 2023-10-09 NOTE — Progress Notes (Signed)
 [facility] MD Progress Note  10/09/2023 3:32 PM Dewaine Morocho  MRN:  [id number]  Principal Problem: MDD (major depressive disorder), recurrent severe, without psychosis (HCC) Diagnosis: Principal Problem:   MDD (major depressive disorder), recurrent severe, without psychosis (HCC) Active Problems:   Nicotine use disorder   Cocaine use disorder (HCC)   Tetrahydrocannabinol (THC) use disorder, mild, abuse   Anxiety  HPI: Philip Collins is a 58 y.o.AA male with a prior history of cocaine use d/o who presented to the [facility] behavioral health center on 03/29 with complaints of depressive symptoms & psychosocial stressors status post relapsing on crack cocaine, and being kicked out of an [place] house.  Patient was initially discharged, refused to leave the lobby, & as per [facility] documentation: "Pt never left the lobby after he was discharged and as per [facility] reports "feels like he is a danger to himself" to registration."  He was reassessed by provider, and presented with passive SI. Per provider at [facility]:  "Patient is stating that he does not know what he may do to himself if he is discharged. Patient has some passive suicidal ideation with no plan or intent." (Bobbitt, Franchot Mimes, NP, Date of Service: 10/05/2023 12:51 AM).  Chart review from last 24 hours: Chart reviewed today. Patient discussed at multidisciplinary team meeting. Blood pressure 86/70 this morning; 138/91 on recheck. within defined limit. Documented sleep hours: 11.0. Nursing staff reports no behavioral issues or concerns overnight. He is compliant with routine psychotropic medication regimen without difficulty. Required as needed hydroxyzine and trazodone last night, according to the nursing record.    Daily Evaluation:  On assessment today, Duc reports that his mood is euthymic, improved since admission, and stable. Reports that anxiety and depression symptoms are at manageable level.  Sleep is stable. Appetite is stable.   Concentration is without complaint.  Energy level is adequate. Denies having any HI.  Denies having psychotic symptoms.  Denies having side effects to current psychiatric medications.   Ihan reports that he recently discovered his insurance may provide housing benefits.  He states that he is seeking approval to retrieve phone numbers from his device to contact friends for possible housing options.  He continues to experience passive suicidal ideation without intent or plan, expressing that he would be "okay with going to sleep and not waking up" and that he feels exhausted from existing.  However, he acknowledges having future oriented thoughts, stating that he is scheduled to work an overnight shift at [employer] tomorrow night from 10 PM to 6 AM.  [name] reports challenges with shelter living, stating that the shelter releases residents during the daytime, making it difficult for him to rest.  He also reports that many shelter unable his are heavily impacted by substance abuse, which adds to his distress. He admits to using substances to cope in the past and states that he has a pending paycheck, which he intends to use responsibly.   Werner was encouraged to adhere to his prescribed medication regimen following discharge to reduce the risk of symptom exacerbation and was advised to follow-up with aftercare appointments.  He verbalized understanding of these recommendations.   The case was discussed with the attending psychiatrist, Tobias Alexander, with the following recommendations: - Discontinue Wellbutrin  - Initiate Lexapro 10 mg daily for management of depressive symptoms  Discharge Planning: The tentative discharge date is set for Friday, April 04.    Total Time spent with patient: 45 minutes  Past Psychiatric History: See H &  P  Past Medical History:  Past Medical History:  Diagnosis Date   BPH with obstruction/lower urinary tract symptoms    Bradycardia 01/15/2022   Broken tooth  01/15/2022   Erectile dysfunction    Incomplete bladder emptying 01/15/2022   Lump of left breast 02/13/2022   Prediabetes 02/13/2022   Substance abuse (HCC)    drug    Past Surgical History:  Procedure Laterality Date   BUNIONECTOMY Bilateral    ~30 years ago   COLONOSCOPY WITH PROPOFOL N/A 03/20/2022   Procedure: COLONOSCOPY WITH PROPOFOL;  Surgeon: Wyline Mood, MD;  Location: [facility] ENDOSCOPY;  Service: Gastroenterology;  Laterality: N/A;   KNEE ARTHROSCOPY Left    ~when patient was [age]   Family History:  Family History  Problem Relation Age of Onset   Hypertension Mother    Diabetes Mother    Kidney failure Mother    Other Father        "gum disease"   Hypertension Sister    Hypertension Sister    Other Maternal Grandmother        unknown medical history   Dementia Maternal Grandfather    Other Paternal Grandmother        unknown medical history   Other Paternal Grandfather        unknown medical history   Prostate cancer Neg Hx    Bladder Cancer Neg Hx    Kidney cancer Neg Hx    Breast cancer Neg Hx    Family Psychiatric  History: See H & P Social History:  Social History   Substance and Sexual Activity  Alcohol Use Not Currently   Comment: occassional, last use 10/09/21     Social History   Substance and Sexual Activity  Drug Use Not Currently   Types: Marijuana, Cocaine, "Crack" cocaine, Methamphetamines   Comment: last use 10/09/21    Social History   Socioeconomic History   Marital status: Divorced    Spouse name: Not on file   Number of children: 4   Years of education: Not on file   Highest education level: Not on file  Occupational History   Not on file  Tobacco Use   Smoking status: Every Day    Current packs/day: 0.50    Average packs/day: 0.5 packs/day for 37.0 years (18.5 ttl pk-yrs)    Types: Cigarettes    Passive exposure: Current   Smokeless tobacco: Never   Tobacco comments:    Patient is at [facility] since 10/10/21, previous to that he  was homeless    Patient trying to quit smoking. Patient states he has bought his last pack  Vaping Use   Vaping status: Never Used  Substance and Sexual Activity   Alcohol use: Not Currently    Comment: occassional, last use 10/09/21   Drug use: Not Currently    Types: Marijuana, Cocaine, "Crack" cocaine, Methamphetamines    Comment: last use 10/09/21   Sexual activity: Not Currently  Other Topics Concern   Not on file  Social History Narrative   Lives sober living of [place].    Social Drivers of Corporate investment banker Strain: Not on file  Food Insecurity: Food Insecurity Present (10/05/2023)   Hunger Vital Sign    Worried About Running Out of Food in the Last Year: Often true    Ran Out of Food in the Last Year: Often true  Transportation Needs: Unmet Transportation Needs (10/05/2023)   PRAPARE - Administrator, Civil Service (Medical): Yes  Lack of Transportation (Non-Medical): Yes  Physical Activity: Not on file  Stress: Not on file  Social Connections: Not on file   Sleep: Good  Appetite:  Fair  Current Medications: Current Facility-Administered Medications  Medication Dose Route Frequency Provider Last Rate Last Admin   acetaminophen (TYLENOL) tablet 650 mg  650 mg Oral Q6H PRN McLauchlin, Angela, NP   650 mg at 10/07/23 2110   alum & mag hydroxide-simeth (MAALOX/MYLANTA) 200-200-20 MG/5ML suspension 30 mL  30 mL Oral Q4H PRN McLauchlin, Angela, NP       haloperidol (HALDOL) tablet 5 mg  5 mg Oral TID PRN McLauchlin, Angela, NP       And   diphenhydrAMINE (BENADRYL) capsule 50 mg  50 mg Oral TID PRN McLauchlin, Angela, NP       haloperidol lactate (HALDOL) injection 5 mg  5 mg Intramuscular TID PRN McLauchlin, Angela, NP       And   diphenhydrAMINE (BENADRYL) injection 50 mg  50 mg Intramuscular TID PRN McLauchlin, Angela, NP       And   LORazepam (ATIVAN) injection 2 mg  2 mg Intramuscular TID PRN McLauchlin, Angela, NP       haloperidol lactate  (HALDOL) injection 10 mg  10 mg Intramuscular TID PRN McLauchlin, Angela, NP       And   diphenhydrAMINE (BENADRYL) injection 50 mg  50 mg Intramuscular TID PRN McLauchlin, Angela, NP       And   LORazepam (ATIVAN) injection 2 mg  2 mg Intramuscular TID PRN McLauchlin, Angela, NP       escitalopram (LEXAPRO) tablet 10 mg  10 mg Oral Daily Golda Acre, MD   10 mg at 10/09/23 1303   feeding supplement (ENSURE ENLIVE / ENSURE PLUS) liquid 237 mL  237 mL Oral TID BM Nkwenti, Doris, NP   237 mL at 10/09/23 1526   hydrOXYzine (ATARAX) tablet 25 mg  25 mg Oral TID PRN McLauchlin, Marylene Land, NP   25 mg at 10/08/23 2105   magnesium hydroxide (MILK OF MAGNESIA) suspension 30 mL  30 mL Oral Daily PRN McLauchlin, Angela, NP       naltrexone (DEPADE) tablet 50 mg  50 mg Oral Daily Nkwenti, Doris, NP   50 mg at 10/09/23 0758   traZODone (DESYREL) tablet 50 mg  50 mg Oral QHS PRN Starleen Blue, NP   50 mg at 10/08/23 2105   Vitamin D (Ergocalciferol) (DRISDOL) 1.25 MG (50000 UNIT) capsule 50,000 Units  50,000 Units Oral Q7 days Golda Acre, MD   50,000 Units at 10/07/23 1610   vitamin D3 (CHOLECALCIFEROL) tablet 2,000 Units  2,000 Units Oral BID Golda Acre, MD   2,000 Units at 10/09/23 9604    Lab Results:  No results found for this or any previous visit (from the past 48 hours).   Blood Alcohol level:  Lab Results  Component Value Date   ETH <10 10/05/2023   ETH <10 10/12/2022    Metabolic Disorder Labs: Lab Results  Component Value Date   HGBA1C 5.5 10/06/2023   MPG 111.15 10/06/2023   No results found for: "PROLACTIN" Lab Results  Component Value Date   CHOL 157 10/06/2023   TRIG 132 10/06/2023   HDL 67 10/06/2023   CHOLHDL 2.3 10/06/2023   VLDL 26 10/06/2023   LDLCALC 64 10/06/2023   LDLCALC 79 10/12/2022    Physical Findings: AIMS:  , ,  , N/A ,    CIWA:  COWS:     Musculoskeletal: Strength & Muscle Tone: within normal limits Gait & Station: normal Patient  leans: N/A  Psychiatric Specialty Exam:  Presentation  General Appearance:  Appropriate for Environment  Eye Contact: Good  Speech: Clear and Coherent; Normal Rate  Speech Volume: Normal  Handedness: Right   Mood and Affect  Mood: Euthymic  Affect: Congruent   Thought Process  Thought Processes: Coherent; Goal Directed; Linear  Descriptions of Associations:Intact  Orientation:Full (Time, Place and Person)  Thought Content:Logical; WDL  History of Schizophrenia/Schizoaffective disorder:No  Duration of Psychotic Symptoms:No data recorded Hallucinations:Hallucinations: None  Ideas of Reference:None  Suicidal Thoughts:Suicidal Thoughts: Yes, Passive SI Passive Intent and/or Plan: Without Intent; Without Plan  Homicidal Thoughts:Homicidal Thoughts: No   Sensorium  Memory: Immediate Fair  Judgment: Fair  Insight: Fair   Chartered certified accountant: Fair  Attention Span: Fair  Recall: [employer] of Knowledge: Fair  Language: Fair   Psychomotor Activity  Psychomotor Activity: Psychomotor Activity: Normal   Assets  Assets: Manufacturing systems engineer; Desire for Improvement; Resilience   Sleep  Sleep: Sleep: Good    Physical Exam: Physical Exam Vitals and nursing note reviewed.  Constitutional:      Appearance: Normal appearance.  HENT:     Head: Normocephalic.  Eyes:     Pupils: Pupils are equal, round, and reactive to light.  Cardiovascular:     Pulses: Normal pulses.     Comments: Blood Pressure 138/91 Pulmonary:     Effort: No respiratory distress.  Musculoskeletal:        General: Normal range of motion.     Cervical back: Normal range of motion.  Neurological:     General: No focal deficit present.     Mental Status: He is alert and oriented to person, place, and time.    Review of Systems  Constitutional: Negative.   Respiratory:  Negative for shortness of breath.   Cardiovascular:  Negative for  chest pain and palpitations.  Gastrointestinal:  Negative for constipation, diarrhea, nausea and vomiting.  Musculoskeletal:  Negative for falls.  Skin: Negative.   Neurological:  Negative for dizziness, tingling, tremors and headaches.  Psychiatric/Behavioral:  Positive for depression, substance abuse and suicidal ideas (passive). Negative for hallucinations and memory loss. The patient is nervous/anxious and has insomnia.   All other systems reviewed and are negative.  Blood pressure (!) 138/91, pulse 62, temperature 97.7 F (36.5 C), temperature source Oral, resp. rate 18, height 5\' 11"  (1.803 m), weight 67.4 kg, SpO2 100%. Body mass index is 20.73 kg/m.  Treatment Plan Summary: Daily contact with patient to assess and evaluate symptoms and progress in treatment and Medication management   Safety and Monitoring: Voluntary admission to inpatient psychiatric unit for safety, stabilization and treatment Daily contact with patient to assess and evaluate symptoms and progress in treatment Patient's case to be discussed in multi-disciplinary team meeting Observation Level : q15 minute checks Vital signs: q12 hours Precautions: Safety   Long Term Goal(s): Improvement in symptoms so as ready for discharge   Short Term Goals: Ability to identify changes in lifestyle to reduce recurrence of condition will improve, Ability to verbalize feelings will improve, Ability to disclose and discuss suicidal ideas, Ability to demonstrate self-control will improve, Ability to identify and develop effective coping behaviors will improve, Ability to maintain clinical measurements within normal limits will improve, Compliance with prescribed medications will improve, and Ability to identify triggers associated with substance abuse/mental health issues will improve   Diagnoses  Principal Problem:   MDD (major depressive disorder), recurrent severe, without psychosis (HCC) Active Problems:   Nicotine use  disorder   Cocaine use disorder (HCC)   Tetrahydrocannabinol (THC) use disorder, mild, abuse   Anxiety   Medications: -Continue Vitamin D 50.000 units weekly for low Vitamin D levels  -Discontinued Zoloft on admission -Discontinue Wellbutrin on 10/09/2023 -Initiate Lexapro 10 mg daily for management of anxiety and depressive symptoms  -Continue Naltrexone 50 mg for cocaine use d/o -Continue nicotine patch daily for nicotine use d/o    PRNS -Continue Trazodone 50 mg nightly PRN for sleep -Continue Hydroxyzine 25 mg TID PRN for anxiety -Continue agitation protocol: Ativan/Benadryl/Haldol PRN-See MAR -Continue Tylenol 650 mg every 6 hours PRN for mild pain -Continue Maalox 30 mg every 4 hrs PRN for indigestion -Continue Milk of Magnesia as needed every 6 hrs for constipation   Educated on rationales, benefits, possible side effects of medications, verbalizes understanding, agreeable to trials.   Discharge Planning: Social work and case management to assist with discharge planning and identification of hospital follow-up needs prior to discharge Estimated LOS: 5-7 days Discharge Concerns: Need to establish a safety plan; Medication compliance and effectiveness Discharge Goals: Return home with outpatient referrals for mental health follow-up including medication management/psychotherapy   I certify that inpatient services furnished can reasonably be expected to improve the patient's condition.    Norma Fredrickson, NP 10/09/2023, 3:32 PM Patient ID: Rosary Lively, male   DOB: 1965-09-17, 58 y.o.   MRN: [id number] Patient ID: Riaz Onorato, male   DOB: 07-17-1965, 58 y.o.   MRN: [id number]

## 2023-10-09 NOTE — Group Note (Signed)
 LCSW Group Therapy Note   Group Date: 10/09/2023 Start Time: 1100 End Time: 1200  Participation:  patient was present and actively participated in the discussion.  He was insightful and shared personal experiences.  Title:  Healing From Within: Understanding Our Past, Building Our Future  Objective:  To help participants understand the impact of early experiences on mental and physical health, with a focus on Adverse Childhood Experiences (ACEs), and to explore ways to build resilience and healing.  Group Goals: Understand ACEs and Their Impact: Learn how childhood experiences shape mental and physical health. Build Resilience: Develop strategies for overcoming challenges and creating positive change. Promote Healing: Recognize the value of support and the possibility of healing through therapy and self-care.  Summary:  In today's session, we discussed how early experiences, especially ACEs, impact mental and physical health. We explored the effects of stress, abuse, and neglect on brain development and well-being. The group focused on resilience, understanding that healing and positive change are possible with support and self-awareness.  Therapeutic Modalities Used: Psychoeducation: Sharing information about ACEs and their effects. Cognitive Behavioral Therapy (CBT): Helping reframe negative thought patterns. Trauma-Informed Therapy: Creating a safe, supportive space for healing.   Alla Feeling, LCSWA 10/09/2023  12:46 PM

## 2023-10-09 NOTE — Progress Notes (Signed)
   10/09/23 0559  15 Minute Checks  Location Bedroom  Visual Appearance Calm  Behavior Composed  Sleep (Behavioral Health Patients Only)  Calculate sleep? (Click Yes once per 24 hr at 0600 safety check) Yes  Documented sleep last 24 hours 11

## 2023-10-09 NOTE — Plan of Care (Signed)
  Problem: Education: Goal: Emotional status will improve Outcome: Progressing Goal: Mental status will improve Outcome: Progressing   Problem: Activity: Goal: Interest or engagement in activities will improve Outcome: Progressing Goal: Sleeping patterns will improve Outcome: Progressing   Problem: Coping: Goal: Ability to verbalize frustrations and anger appropriately will improve Outcome: Progressing Goal: Ability to demonstrate self-control will improve Outcome: Progressing   Problem: Health Behavior/Discharge Planning: Goal: Identification of resources available to assist in meeting health care needs will improve Outcome: Progressing   Problem: Physical Regulation: Goal: Ability to maintain clinical measurements within normal limits will improve Outcome: Progressing   Problem: Safety: Goal: Periods of time without injury will increase Outcome: Progressing

## 2023-10-10 ENCOUNTER — Other Ambulatory Visit (HOSPITAL_COMMUNITY): Payer: Self-pay

## 2023-10-10 DIAGNOSIS — F332 Major depressive disorder, recurrent severe without psychotic features: Secondary | ICD-10-CM | POA: Diagnosis not present

## 2023-10-10 MED ORDER — VITAMIN D3 25 MCG PO TABS
2000.0000 [IU] | ORAL_TABLET | Freq: Two times a day (BID) | ORAL | 0 refills | Status: AC
  Filled 2023-10-10: qty 120, 30d supply, fill #0

## 2023-10-10 MED ORDER — NALTREXONE HCL 50 MG PO TABS
50.0000 mg | ORAL_TABLET | Freq: Every day | ORAL | 0 refills | Status: AC
  Filled 2023-10-10: qty 30, 30d supply, fill #0

## 2023-10-10 MED ORDER — ENSURE ENLIVE PO LIQD: 237.0000 mL | Freq: Three times a day (TID) | ORAL | Status: AC

## 2023-10-10 MED ORDER — ESCITALOPRAM OXALATE 10 MG PO TABS
10.0000 mg | ORAL_TABLET | Freq: Every day | ORAL | 0 refills | Status: AC
  Filled 2023-10-10: qty 30, 30d supply, fill #0

## 2023-10-10 MED ORDER — VITAMIN D (ERGOCALCIFEROL) 1.25 MG (50000 UNIT) PO CAPS
50000.0000 [IU] | ORAL_CAPSULE | ORAL | 0 refills | Status: AC
  Filled 2023-10-10: qty 2, 14d supply, fill #0

## 2023-10-10 MED ORDER — HYDROXYZINE HCL 25 MG PO TABS
25.0000 mg | ORAL_TABLET | Freq: Three times a day (TID) | ORAL | 0 refills | Status: AC | PRN
  Filled 2023-10-10: qty 30, 10d supply, fill #0

## 2023-10-10 MED ORDER — TRAZODONE HCL 50 MG PO TABS
50.0000 mg | ORAL_TABLET | Freq: Every evening | ORAL | 0 refills | Status: AC | PRN
  Filled 2023-10-10: qty 30, 30d supply, fill #0

## 2023-10-10 NOTE — Discharge Summary (Addendum)
 Physician Discharge Summary Note  Patient:  Philip Collins is an 58 y.o., male MRN:  [id number] DOB:  06-23-1966 Patient phone:  [phone] (home)  Patient address:   [address] [place] [address],  Total Time spent with patient: 45 minutes  Date of Admission:  10/05/2023 Date of Discharge: 10/10/23   Reason for Admission:  Philip Collins is a 52 y.o.AA male with a prior history of cocaine use d/o who presented to the [facility] on 03/29 with complaints of depressive symptoms & psychosocial stressors status post relapsing on crack cocaine, and being kicked out of an [place] house.  Patient was initially discharged, refused to leave the lobby, & as per [facility] documentation: "Pt never left the lobby after he was discharged and as per [facility] reports "feels like he is a danger to himself" to registration."    Principal Problem: MDD (major depressive disorder), recurrent severe, without psychosis (HCC) Discharge Diagnoses: Principal Problem:   MDD (major depressive disorder), recurrent severe, without psychosis (HCC) Active Problems:   Nicotine use disorder   Cocaine use disorder (HCC)   Tetrahydrocannabinol (THC) use disorder, mild, abuse   Anxiety   Past Psychiatric History: See history and physical  Past Medical History:  Past Medical History:  Diagnosis Date   BPH with obstruction/lower urinary tract symptoms    Bradycardia 01/15/2022   Broken tooth 01/15/2022   Erectile dysfunction    Incomplete bladder emptying 01/15/2022   Lump of left breast 02/13/2022   Prediabetes 02/13/2022   Substance abuse (HCC)    drug    Past Surgical History:  Procedure Laterality Date   BUNIONECTOMY Bilateral    ~30 years ago   COLONOSCOPY WITH PROPOFOL N/A 03/20/2022   Procedure: COLONOSCOPY WITH PROPOFOL;  Surgeon: Wyline Mood, MD;  Location: [facility] ENDOSCOPY;  Service: Gastroenterology;  Laterality: N/A;   KNEE ARTHROSCOPY Left    ~when  patient was [age]   Family History:  Family History  Problem Relation Age of Onset   Hypertension Mother    Diabetes Mother    Kidney failure Mother    Other Father        "gum disease"   Hypertension Sister    Hypertension Sister    Other Maternal Grandmother        unknown medical history   Dementia Maternal Grandfather    Other Paternal Grandmother        unknown medical history   Other Paternal Grandfather        unknown medical history   Prostate cancer Neg Hx    Bladder Cancer Neg Hx    Kidney cancer Neg Hx    Breast cancer Neg Hx    Family Psychiatric  History: See history and physical Social History:  Social History   Substance and Sexual Activity  Alcohol Use Not Currently   Comment: occassional, last use 10/09/21     Social History   Substance and Sexual Activity  Drug Use Not Currently   Types: Marijuana, Cocaine, "Crack" cocaine, Methamphetamines   Comment: last use 10/09/21    Social History   Socioeconomic History   Marital status: Divorced    Spouse name: Not on file   Number of children: 4   Years of education: Not on file   Highest education level: Not on file  Occupational History   Not on file  Tobacco Use   Smoking status: Every Day    Current packs/day: 0.50    Average packs/day: 0.5  packs/day for 37.0 years (18.5 ttl pk-yrs)    Types: Cigarettes    Passive exposure: Current   Smokeless tobacco: Never   Tobacco comments:    Patient is at [facility] since 10/10/21, previous to that he was homeless    Patient trying to quit smoking. Patient states he has bought his last pack  Vaping Use   Vaping status: Never Used  Substance and Sexual Activity   Alcohol use: Not Currently    Comment: occassional, last use 10/09/21   Drug use: Not Currently    Types: Marijuana, Cocaine, "Crack" cocaine, Methamphetamines    Comment: last use 10/09/21   Sexual activity: Not Currently  Other Topics Concern   Not on file  Social History Narrative   Lives sober  living of [place].    Social Drivers of Corporate investment banker Strain: Not on file  Food Insecurity: Food Insecurity Present (10/05/2023)   Hunger Vital Sign    Worried About Running Out of Food in the Last Year: Often true    Ran Out of Food in the Last Year: Often true  Transportation Needs: Unmet Transportation Needs (10/05/2023)   PRAPARE - Administrator, Civil Service (Medical): Yes    Lack of Transportation (Non-Medical): Yes  Physical Activity: Not on file  Stress: Not on file  Social Connections: Not on file    Hospital Course:  During the patient's hospitalization, patient had extensive initial psychiatric evaluation, and follow-up psychiatric evaluations every day.  Psychiatric diagnoses provided upon initial assessment:  Principal Problem:   MDD (major depressive disorder), recurrent severe, without psychosis (HCC) Active Problems:   Nicotine use disorder   Cocaine use disorder (HCC)   Tetrahydrocannabinol (THC) use disorder, mild, abuse   Anxiety  Patient's psychiatric medications were adjusted on admission: Zoloft was discontinued on admission and he was started on Wellbutrin 150 mg daily for management of anxiety and depressive symptoms. Naltrexone 25 mg daily was initiated for cocaine use disorder.   During the hospitalization, other adjustments were made to the patient's psychiatric medication regimen: Wellbutrin was discontinued and he was started on Lexapro 10 mg daily for management of anxiety and depressive symptoms. Optimized naltrexone from 25 mg to 50 mg daily for cocaine use disorder.  Patient's care was discussed during the interdisciplinary team meeting every day during the hospitalization.  The patient denies having side effects to prescribed psychiatric medication.  Gradually, patient started adjusting to milieu. The patient was evaluated each day by a clinical provider to ascertain response to treatment. Improvement was noted by the  patient's report of decreasing symptoms, improved sleep and appetite, affect, medication tolerance, behavior, and participation in unit programming.  Patient was asked each day to complete a self inventory noting mood, mental status, pain, new symptoms, anxiety and concerns.    On the day of discharge, Philip Collins reports that his mood has improved since admission and remains stable.  He reports his anxiety and depressive symptoms are at a manageable level.  His sleep and appetite are stable. He does not report any issues with concentration, and his energy level is adequate. He denies having any suicidal thoughts. Denies having any suicidal intent and plan. Patient denies having homicidal thoughts.  Patient denies having auditory hallucinations.  Patient denies any visual hallucinations or other symptoms of psychosis. The patient was motivated to continue taking medication with a goal of continued improvement in mental health.   The patient remains future oriented, stating that his short-term plan includes  attending an NA meeting to secure a sponsor and meeting with his peers support person.  He states that he is able to return to the [place] house on Saturday evening (April 12). He states that he is scheduled to work an overnight shift at [employer].  After discharge, he states that he intends to go to the [place] house to retrieve a few personal belongings and continue working on securing a safe place to stay until he can return there.  He reports having a "small check" waiting from [employer]in [employer] and states that he may need to stay in a hotel for a few days until he can move back into the [place] house.   The patient reports their target psychiatric symptoms of anxiety and depression responded well to the psychiatric medications, and the patient reports overall benefit other psychiatric hospitalization. Supportive psychotherapy was provided to the patient. The patient also participated in regular group  therapy while hospitalized. Coping skills, problem solving as well as relaxation therapies were also part of the unit programming.  Labs were reviewed with the patient, and abnormal results were discussed with the patient.  The patient is able to verbalize their individual safety plan to this provider.  # It is recommended to the patient to continue psychiatric medications as prescribed, after discharge from the hospital.    # It is recommended to the patient to follow up with your outpatient psychiatric provider and PCP.  # It was discussed with the patient, the impact of alcohol, drugs, tobacco have been there overall psychiatric and medical wellbeing, and total abstinence from substance use was recommended the patient.ed.  # Prescriptions provided or sent directly to preferred pharmacy at discharge. Patient agreeable to plan. Given opportunity to ask questions. Appears to feel comfortable with discharge.    # In the event of worsening symptoms, the patient is instructed to call the crisis hotline, 911 and or go to the nearest ED for appropriate evaluation and treatment of symptoms. To follow-up with primary care provider for other medical issues, concerns and or health care needs  # Patient was discharged home with a plan to follow up as noted below.   Physical Findings: AIMS:  , ,  , N/A ,    CIWA:   N/A COWS:   N/A  Musculoskeletal: Strength & Muscle Tone: within normal limits Gait & Station: normal Patient leans: N/A   Psychiatric Specialty Exam:  Presentation  General Appearance:  Casual  Eye Contact: Good  Speech: Clear and Coherent; Normal Rate  Speech Volume: Normal  Handedness: Right   Mood and Affect  Mood: Euthymic  Affect: Appropriate; Congruent   Thought Process  Thought Processes: Coherent; Goal Directed; Linear  Descriptions of Associations:Intact  Orientation:Full (Time, Place and Person)  Thought Content:Logical; WDL  History of  Schizophrenia/Schizoaffective disorder:No  Duration of Psychotic Symptoms:No data recorded Hallucinations:Hallucinations: None  Ideas of Reference:None  Suicidal Thoughts:Suicidal Thoughts: Yes, Passive SI Passive Intent and/or Plan: Without Intent; Without Plan  Homicidal Thoughts:Homicidal Thoughts: No   Sensorium  Memory: Immediate Good; Recent Good; Remote Good  Judgment: Intact  Insight: Good; Present   Executive Functions  Concentration: Good  Attention Span: Good  Recall: Good  Fund of Knowledge: Good  Language: Good   Psychomotor Activity  Psychomotor Activity: Psychomotor Activity: Normal   Assets  Assets: Communication Skills; Desire for Improvement; Resilience; Social Support; Talents/Skills   Sleep  Sleep: Sleep: Good    Physical Exam: Physical Exam Vitals and nursing note reviewed.  Constitutional:      General: He is not in acute distress.    Appearance: Normal appearance. He is not ill-appearing.  Cardiovascular:     Rate and Rhythm: Bradycardia present.     Pulses: Normal pulses.     Comments: Heart Rate 56. Patient denies cardiac symptoms.  Pulmonary:     Effort: No respiratory distress.  Neurological:     General: No focal deficit present.     Mental Status: He is alert and oriented to person, place, and time.  Psychiatric:        Mood and Affect: Mood normal.        Behavior: Behavior normal.        Thought Content: Thought content normal.        Judgment: Judgment normal.    Review of Systems  Constitutional: Negative.   Respiratory:  Negative for shortness of breath.   Cardiovascular:  Negative for chest pain and palpitations.  Gastrointestinal: Negative.   Skin: Negative.   Neurological:  Negative for dizziness, tingling, tremors and headaches.  Psychiatric/Behavioral:  Positive for depression (Stable for outpatient management.) and substance abuse (UDS positive for cocaine and marijuana. Cessation  encouraged.). Negative for hallucinations, memory loss and suicidal ideas. The patient is nervous/anxious (Stable for outpatient management.). The patient does not have insomnia.    Blood pressure 117/76, pulse (!) 56, temperature 97.8 F (36.6 C), temperature source Oral, resp. rate 18, height 5\' 11"  (1.803 m), weight 67.4 kg, SpO2 100%. Body mass index is 20.73 kg/m.   Social History   Tobacco Use  Smoking Status Every Day   Current packs/day: 0.50   Average packs/day: 0.5 packs/day for 37.0 years (18.5 ttl pk-yrs)   Types: Cigarettes   Passive exposure: Current  Smokeless Tobacco Never  Tobacco Comments   Patient is at [facility] since 10/10/21, previous to that he was homeless   Patient trying to quit smoking. Patient states he has bought his last pack   Tobacco Cessation:  A prescription for an FDA-approved tobacco cessation medication provided at discharge   Blood Alcohol level:  Lab Results  Component Value Date   [facility] <10 10/05/2023   ETH <10 10/12/2022    Metabolic Disorder Labs:  Lab Results  Component Value Date   HGBA1C 5.5 10/06/2023   MPG 111.15 10/06/2023   No results found for: "PROLACTIN" Lab Results  Component Value Date   CHOL 157 10/06/2023   TRIG 132 10/06/2023   HDL 67 10/06/2023   CHOLHDL 2.3 10/06/2023   VLDL 26 10/06/2023   LDLCALC 64 10/06/2023   LDLCALC 79 10/12/2022    See Psychiatric Specialty Exam and Suicide Risk Assessment completed by Attending Physician prior to discharge.  Discharge destination:  Home  Is patient on multiple antipsychotic therapies at discharge:  No   Has Patient had three or more failed trials of antipsychotic monotherapy by history:  No  Recommended Plan for Multiple Antipsychotic Therapies: NA  Discharge Instructions     Activity as tolerated - No restrictions   Complete by: As directed    Diet - low sodium heart healthy   Complete by: As directed       Allergies as of 10/10/2023   No Known Allergies       Medication List     STOP taking these medications    amoxicillin 875 MG tablet Commonly known as: AMOXIL       TAKE these medications      Indication  escitalopram 10 MG  tablet Commonly known as: LEXAPRO Take 1 tablet (10 mg total) by mouth daily. Start taking on: October 11, 2023  Indication: Generalized Anxiety Disorder, Major Depressive Disorder   feeding supplement Liqd Take 237 mLs by mouth 3 (three) times daily between meals.  Indication: Nutritional Support   hydrOXYzine 25 MG tablet Commonly known as: ATARAX Take 1 tablet (25 mg total) by mouth 3 (three) times daily as needed for anxiety.  Indication: Feeling Anxious   naltrexone 50 MG tablet Commonly known as: DEPADE Take 1 tablet (50 mg total) by mouth daily. Start taking on: October 11, 2023  Indication: Opioid Dependence, cocaine use   traZODone 50 MG tablet Commonly known as: DESYREL Take 1 tablet (50 mg total) by mouth at bedtime as needed for sleep.  Indication: Trouble Sleeping, Major Depressive Disorder   Vitamin D (Ergocalciferol) 1.25 MG (50000 UNIT) Caps capsule Commonly known as: DRISDOL Take 1 capsule (50,000 Units total) by mouth every 7 (seven) days. Start taking on: October 14, 2023  Indication: Vitamin D Deficiency   vitamin D3 25 MCG tablet Commonly known as: CHOLECALCIFEROL Take 2 tablets (2,000 Units total) by mouth 2 (two) times daily.  Indication: Vitamin D Deficiency        Follow-up Information     [facility] Go on 10/15/2023.   Specialty: Professional Counselor Why: Please go to this provider for therapy services on Monday, 10/13/2023.  Otherwise, you can walk in Monday through Friday, from 9 AM to 1 PM for an initial assessment. Contact information: [address] [place] [address] [phone]         [facility]. Go on 10/21/2023.   Specialty: Behavioral Health Why: Please go to this provider for  medication management services on 10/14/2023 at 7 AM.  Otherwise, you may walk-in Monday through Friday, arrive by 7:00 AM for an assessment. Contact information: [address] [place] [address] [phone]        [facility]. Call.   Specialty: Behavioral Health Why: Please call this provider to inquire regarding the (SAIOP) substance abuse intensive outpatient program on Monday, 10/13/2023. Contact information: [address] [place] [address] [phone]        [facility] Follow up.   Why: Referral made Contact information: [address] [place] [address] [phone]                 Follow-up recommendations:   Activity: as tolerated   Diet: heart healthy   Other: -Follow-up with your outpatient psychiatric provider -instructions on appointment date, time, and address (location) are provided to you in discharge paperwork.   -Take your psychiatric medications as prescribed at discharge - instructions are provided to you in the discharge paperwork   -Follow-up with outpatient primary care doctor and other specialists -for management of preventative medicine and chronic medical disease: Vitamin D deficiency,    -Testing: Follow-up with outpatient provider for abnormal lab results: Low Vitamin D level   -If you are prescribed an atypical antipsychotic medication, we recommend that your outpatient psychiatrist follow routine screening for side effects within 3 months of discharge, including monitoring: AIMS scale, height, weight, blood pressure, fasting lipid panel, HbA1c, and fasting blood sugar.    -Recommend total abstinence from alcohol, tobacco, and other illicit drug use at discharge.    -If your psychiatric symptoms recur, worsen, or if you have side effects to your psychiatric medications, call your  outpatient psychiatric provider, 911, [phone] or go to the nearest emergency department.    -If suicidal thoughts occur, immediately call your outpatient psychiatric provider, 911, [phone] or go to the nearest emergency department.    Signed: Norma Fredrickson, NP 10/10/2023, 3:16 PM

## 2023-10-10 NOTE — Group Note (Signed)
 Date:  10/10/2023 Time:  12:41 PM  Group Topic/Focus:  Goals Group:   The focus of this group is to help patients establish daily goals to achieve during treatment and discuss how the patient can incorporate goal setting into their daily lives to aide in recovery.    Participation Level:  Active  Participation Quality:  Appropriate  Affect:  Appropriate   Erasmo Score 10/10/2023, 12:41 PM

## 2023-10-10 NOTE — [facility] Suicide Risk Assessment (Signed)
 [facility] INPATIENT:  Family/Significant Other Suicide Prevention Education  Suicide Prevention Education:  Education Completed; with patient,  (name of family member/significant other) has been identified by the patient as the family member/significant other with whom the patient will be residing, and identified as the person(s) who will aid the patient in the event of a mental health crisis (suicidal ideations/suicide attempt).  With written consent from the patient, the family member/significant other has been provided the following suicide prevention education, prior to the and/or following the discharge of the patient.  Patient was given "Suicide Prevention Information" brochure."  He doesn't have any guns.  The suicide prevention education provided includes the following: Suicide risk factors Suicide prevention and interventions National Suicide Hotline telephone number [facility] assessment telephone number [facility] Emergency Assistance 911 [facility] and/or Residential Mobile Crisis Unit telephone number  Request made of family/significant other to: Remove weapons (e.g., guns, rifles, knives), all items previously/currently identified as safety concern.   Remove drugs/medications (over-the-counter, prescriptions, illicit drugs), all items previously/currently identified as a safety concern.  The family member/significant other verbalizes understanding of the suicide prevention education information provided.  The family member/significant other agrees to remove the items of safety concern listed above.  Hanley Woerner O Oree Hislop, LCSWA 10/10/2023, 10:03 AM

## 2023-10-10 NOTE — [facility] Suicide Risk Assessment (Addendum)
 Suicide Risk Assessment  Discharge Assessment    [facility] Discharge Suicide Risk Assessment   Principal Problem: MDD (major depressive disorder), recurrent severe, without psychosis (HCC) Discharge Diagnoses: Principal Problem:   MDD (major depressive disorder), recurrent severe, without psychosis (HCC) Active Problems:   Nicotine use disorder   Cocaine use disorder (HCC)   Tetrahydrocannabinol (THC) use disorder, mild, abuse   Anxiety  Reason for Admission:  Philip Collins is a 58 y.o.AA male with a prior history of cocaine use d/o who presented to the [facility] on 03/29 with complaints of depressive symptoms & psychosocial stressors status post relapsing on crack cocaine, and being kicked out of an [place] house.  Patient was initially discharged, refused to leave the lobby, & as per [facility] documentation: "Pt never left the lobby after he was discharged and as per [facility] reports "feels like he is a danger to himself" to registration."    On the day of discharge, Kjell reports that his mood has improved since admission and remains stable.  He reports his anxiety and depressive symptoms are at a manageable level.  His sleep and appetite are stable. He does not report any issues with concentration, and his energy level is adequate. He denies having any suicidal thoughts. Denies having any suicidal intent and plan. He remains future oriented, stating that his short-term plan includes attending an NA meeting to secure a sponsor and meeting with his peers support person.  He states that he is able to return to the [place] house on Saturday evening (April 12). He states that he is scheduled to work an overnight shift at [employer].  After discharge, he states that he intends to go to the [place] house to retrieve a few personal belongings and continue working on securing a safe place to stay until he can return there.  He reports having a "small check" waiting from Karin Golden and states that he may need to stay in a hotel for a few days until he can move back into the [place] house.  Total Time spent with patient: 30 minutes  Musculoskeletal: Strength & Muscle Tone: within normal limits Gait & Station: normal Patient leans: N/A  Psychiatric Specialty Exam  Presentation  General Appearance:  Appropriate for Environment  Eye Contact: Good  Speech: Clear and Coherent; Normal Rate  Speech Volume: Normal  Handedness: Right   Mood and Affect  Mood: Euthymic  Duration of Depression Symptoms: Less than two weeks  Affect: Congruent   Thought Process  Thought Processes: Coherent; Goal Directed; Linear  Descriptions of Associations:Intact  Orientation:Full (Time, Place and Person)  Thought Content:Logical; WDL  History of Schizophrenia/Schizoaffective disorder:No  Duration of Psychotic Symptoms:No data recorded Hallucinations:Hallucinations: None  Ideas of Reference:None  Suicidal Thoughts:Suicidal Thoughts: Yes, Passive SI Passive Intent and/or Plan: Without Intent; Without Plan  Homicidal Thoughts:Homicidal Thoughts: No   Sensorium  Memory: Immediate Fair  Judgment: Fair  Insight: Fair   Chartered certified accountant: Fair  Attention Span: Fair  Recall: [employer] of Knowledge: Fair  Language: Fair   Psychomotor Activity  Psychomotor Activity: Psychomotor Activity: Normal   Assets  Assets: Manufacturing systems engineer; Desire for Improvement; Resilience   Sleep  Sleep: Sleep: Good   Physical Exam: Physical Exam ROS Blood pressure 117/76, pulse (!) 56, temperature 97.8 F (36.6 C), temperature source Oral, resp. rate 18, height 5\' 11"  (1.803 m), weight 67.4 kg, SpO2 100%. Body mass index is 20.73 kg/m.  Mental Status Per Nursing  Assessment::   On Admission:  NA  Demographic Factors:  Low socioeconomic status  Loss Factors: Financial problems/change in socioeconomic  status  Historical Factors: Impulsivity  Risk Reduction Factors:   Religious beliefs about death, Employed, Positive social support, and Positive coping skills or problem solving skills  Continued Clinical Symptoms:  Previous Psychiatric Diagnoses and Treatments  Cognitive Features That Contribute To Risk:  None    Suicide Risk:  Minimal: No identifiable suicidal ideation.  Patients presenting with no risk factors but with morbid ruminations; may be classified as minimal risk based on the severity of the depressive symptoms   Follow-up Information     [facility]. Go on 10/15/2023.   Specialty: Professional Counselor Why: Please go to this provider for therapy services Monday through Friday, from 9 am to 1 pm for an initial assessment. Contact information: [address] [place] [address] [phone]         [facility]. Go on 10/21/2023.   Specialty: Behavioral Health Why: Please go to this provider for medication management services on Monday through Friday, arrive by 7:00 am for an assessment. Contact information: [address] [place] [address] [phone]        [facility]. Call.   Specialty: Behavioral Health Why: Please call this provider to inquire regarding the (SAIOP) substance abuse intensive outpatient program. Contact information: [address] [place] [address] [phone]        [facility] Follow up.   Why: Referral made Contact information: [address] [place] [address] [phone]                 Plan Of Care/Follow-up recommendations:   Activity: as tolerated  Diet: heart healthy  Other: -Follow-up with your outpatient psychiatric provider -instructions on appointment date, time, and address (location) are provided to you in discharge paperwork.  -Take your psychiatric  medications as prescribed at discharge - instructions are provided to you in the discharge paperwork  -Follow-up with outpatient primary care doctor and other specialists -for management of preventative medicine and chronic medical disease: Vitamin D deficiency,   -Testing: Follow-up with outpatient provider for abnormal lab results: Low Vitamin D level  -If you are prescribed an atypical antipsychotic medication, we recommend that your outpatient psychiatrist follow routine screening for side effects within 3 months of discharge, including monitoring: AIMS scale, height, weight, blood pressure, fasting lipid panel, HbA1c, and fasting blood sugar.   -Recommend total abstinence from alcohol, tobacco, and other illicit drug use at discharge.   -If your psychiatric symptoms recur, worsen, or if you have side effects to your psychiatric medications, call your outpatient psychiatric provider, 911, [phone] or go to the nearest emergency department.  -If suicidal thoughts occur, immediately call your outpatient psychiatric provider, 911, [phone] or go to the nearest emergency department.   Norma Fredrickson, NP 10/10/2023, 9:46 AM

## 2023-10-10 NOTE — Progress Notes (Signed)
   10/09/23 2251  Psych Admission Type (Psych Patients Only)  Admission Status Voluntary  Psychosocial Assessment  Patient Complaints Anxiety  Eye Contact Fair  Facial Expression Anxious  Affect Appropriate to circumstance  Speech Logical/coherent  Interaction Assertive  Motor Activity Other (Comment) (WDL)  Appearance/Hygiene Unremarkable  Behavior Characteristics Cooperative;Appropriate to situation  Mood Pleasant;Anxious  Aggressive Behavior  Effect No apparent injury  Thought Process  Coherency WDL  Content WDL  Delusions None reported or observed  Perception WDL  Hallucination None reported or observed  Judgment WDL  Confusion None  Danger to Self  Current suicidal ideation? Denies  Agreement Not to Harm Self Yes  Description of Agreement verbal

## 2023-10-10 NOTE — Plan of Care (Signed)
   Problem: Activity: Goal: Interest or engagement in activities will improve Outcome: Progressing Goal: Sleeping patterns will improve Outcome: Progressing   Problem: Coping: Goal: Ability to verbalize frustrations and anger appropriately will improve Outcome: Progressing Goal: Ability to demonstrate self-control will improve Outcome: Progressing   Problem: Health Behavior/Discharge Planning: Goal: Identification of resources available to assist in meeting health care needs will improve Outcome: Progressing Goal: Compliance with treatment plan for underlying cause of condition will improve Outcome: Progressing   Problem: Safety: Goal: Periods of time without injury will increase Outcome: Progressing

## 2023-10-10 NOTE — Progress Notes (Addendum)
  [facility] Adult Case Management Discharge Plan :  Will you be returning to the same living situation after discharge:  No, patient was in [facility] but relapsed so he will be allowed to return there in 2 weeks.  Patient is unhoused. At discharge, do you have transportation home?: Yes,  CSW arranged transportation through [facility] for 2 PM  Do you have the ability to pay for your medications: Yes,  patient has Medicaid.  Patient was given "regular one-ride pass" and "one way PT" for medical appointments.  Release of information consent forms completed and in the chart;  Patient's signature needed at discharge.  Patient to Follow up at:  Follow-up Information     [facility]. Go on 10/15/2023.   Specialty: Professional Counselor Why: Please go to this provider for therapy services on Monday, 10/13/2023.  Otherwise, you can walk in Monday through Friday, from 9 AM to 1 PM for an initial assessment. Contact information: [address] [place] [address] [phone]         [facility]. Go on 10/21/2023.   Specialty: Behavioral Health Why: Please go to this provider for medication management services on Tuesday, 10/14/2023 at 7 AM.  Otherwise, you may walk-in Monday through Friday, arrive by 7:00 AM for an assessment. Contact information: [address] [place] [address] [phone]        [facility]. Call.   Specialty: Behavioral Health Why: Please call this provider to inquire regarding the (SAIOP) substance abuse intensive outpatient program on Monday, 10/13/2023. Contact information: [address] [place] [address] [phone]        [facility] Follow up.   Why: Referral made Contact information: [address] [place] [address] [phone]                 Next level of care provider has access to [facility]  Link:no  Safety Planning and Suicide Prevention discussed: Yes,  completed with patient  Has patient been referred to the Quitline?: Patient refused referral for treatment  Patient has been referred for addiction treatment: Patient came to the hospital because he relapsed.  He said he will work on it in outpatient therapy.  In addition to this, he is attending AA & NA meetings.  Vester Balthazor O Emilya Justen, LCSWA 10/10/2023, 11:52 AM

## 2023-10-10 NOTE — Progress Notes (Signed)
 Patient discharged to home via taxi. Discharge instructions, all required discharge documents and information about follow-up appointment given to pt with verbalization of understanding. All personal belongings returned to pt at time of discharge. Pt escorted to lobby by RN at 1355.  10/10/23 0825  Psych Admission Type (Psych Patients Only)  Admission Status Voluntary  Psychosocial Assessment  Patient Complaints Anxiety  Eye Contact Fair  Facial Expression Anxious  Affect Appropriate to circumstance  Speech Logical/coherent  Interaction Assertive  Motor Activity Other (Comment) (WNL)  Appearance/Hygiene Unremarkable  Behavior Characteristics Cooperative;Appropriate to situation  Mood Anxious;Pleasant  Thought Process  Coherency WDL  Content WDL  Delusions None reported or observed  Perception WDL  Hallucination None reported or observed  Judgment WDL  Confusion None  Danger to Self  Current suicidal ideation? Denies  Agreement Not to Harm Self Yes  Description of Agreement Verbal  Danger to Others  Danger to Others None reported or observed

## 2023-10-10 NOTE — Progress Notes (Signed)
 Collateral contact - Fayrene Fearing (Peer Support) from [facility] (therapy services) [phone]  Fayrene Fearing said that his supervisor, Trinna Post, is trying to help patient find temporary housing until he can return to [employer].    Mallory Schaad, LCSWA 10/10/2023

## 2023-10-22 ENCOUNTER — Other Ambulatory Visit (HOSPITAL_COMMUNITY): Payer: Self-pay
